# Patient Record
Sex: Female | Born: 1965 | Race: Black or African American | Hispanic: No | Marital: Single | Smoking: Current some day smoker
Health system: Southern US, Community
[De-identification: ages and names within clinical notes are randomized; demographics above are authoritative.]

## PROBLEM LIST (undated history)

## (undated) DIAGNOSIS — G629 Polyneuropathy, unspecified: Secondary | ICD-10-CM

## (undated) HISTORY — PX: ABDOMINAL HYSTERECTOMY: SHX81

---

## 2004-04-02 ENCOUNTER — Emergency Department: Payer: Self-pay | Admitting: Emergency Medicine

## 2005-02-01 ENCOUNTER — Emergency Department: Payer: Self-pay | Admitting: Emergency Medicine

## 2006-04-10 ENCOUNTER — Emergency Department: Payer: Self-pay | Admitting: Emergency Medicine

## 2007-01-29 ENCOUNTER — Emergency Department: Payer: Self-pay | Admitting: Internal Medicine

## 2007-08-30 ENCOUNTER — Emergency Department: Payer: Self-pay | Admitting: Emergency Medicine

## 2007-12-15 ENCOUNTER — Emergency Department: Payer: Self-pay | Admitting: Internal Medicine

## 2008-03-12 ENCOUNTER — Emergency Department: Payer: Self-pay | Admitting: Emergency Medicine

## 2009-03-04 ENCOUNTER — Emergency Department: Payer: Self-pay | Admitting: Emergency Medicine

## 2009-04-24 ENCOUNTER — Emergency Department: Payer: Self-pay | Admitting: Emergency Medicine

## 2010-12-25 ENCOUNTER — Emergency Department: Payer: Self-pay | Admitting: Emergency Medicine

## 2012-10-05 ENCOUNTER — Emergency Department: Payer: Self-pay | Admitting: Internal Medicine

## 2012-10-05 LAB — URINALYSIS, COMPLETE
Blood: NEGATIVE
Nitrite: POSITIVE
Ph: 5 (ref 4.5–8.0)
Protein: 30
RBC,UR: 2 /HPF (ref 0–5)
Specific Gravity: 1.032 (ref 1.003–1.030)
Squamous Epithelial: 22
WBC UR: 10 /HPF (ref 0–5)

## 2012-10-05 LAB — COMPREHENSIVE METABOLIC PANEL
BUN: 10 mg/dL (ref 7–18)
Chloride: 109 mmol/L — ABNORMAL HIGH (ref 98–107)
Co2: 25 mmol/L (ref 21–32)
Creatinine: 0.75 mg/dL (ref 0.60–1.30)
Glucose: 110 mg/dL — ABNORMAL HIGH (ref 65–99)
Osmolality: 275 (ref 275–301)
SGOT(AST): 23 U/L (ref 15–37)
SGPT (ALT): 22 U/L (ref 12–78)
Total Protein: 7.9 g/dL (ref 6.4–8.2)

## 2012-10-05 LAB — CBC
HCT: 38.3 % (ref 35.0–47.0)
HGB: 13.3 g/dL (ref 12.0–16.0)
MCHC: 34.7 g/dL (ref 32.0–36.0)
MCV: 90 fL (ref 80–100)
RDW: 14.9 % — ABNORMAL HIGH (ref 11.5–14.5)
WBC: 10.2 10*3/uL (ref 3.6–11.0)

## 2013-03-06 ENCOUNTER — Emergency Department: Payer: Self-pay | Admitting: Emergency Medicine

## 2013-03-06 LAB — BASIC METABOLIC PANEL
ANION GAP: 4 — AB (ref 7–16)
BUN: 13 mg/dL (ref 7–18)
Calcium, Total: 8.7 mg/dL (ref 8.5–10.1)
Chloride: 105 mmol/L (ref 98–107)
Co2: 27 mmol/L (ref 21–32)
Creatinine: 0.68 mg/dL (ref 0.60–1.30)
EGFR (African American): >60
EGFR (Non-African Amer.): >60
Glucose: 123 mg/dL — ABNORMAL HIGH (ref 65–99)
Osmolality: 273 (ref 275–301)
Potassium: 3.7 mmol/L (ref 3.5–5.1)
Sodium: 136 mmol/L (ref 136–145)

## 2013-03-06 LAB — CBC
HCT: 41.1 % (ref 35.0–47.0)
HGB: 13.7 g/dL (ref 12.0–16.0)
MCH: 30 pg (ref 26.0–34.0)
MCHC: 33.3 g/dL (ref 32.0–36.0)
MCV: 90 fL (ref 80–100)
Platelet: 170 10*3/uL (ref 150–440)
RBC: 4.56 10*6/uL (ref 3.80–5.20)
RDW: 14.5 % (ref 11.5–14.5)
WBC: 8.8 10*3/uL (ref 3.6–11.0)

## 2013-03-06 LAB — TROPONIN I

## 2013-04-19 ENCOUNTER — Emergency Department: Payer: Self-pay | Admitting: Emergency Medicine

## 2013-05-01 ENCOUNTER — Emergency Department: Payer: Self-pay | Admitting: Emergency Medicine

## 2013-07-31 ENCOUNTER — Emergency Department: Payer: Self-pay | Admitting: Emergency Medicine

## 2013-09-06 ENCOUNTER — Emergency Department: Payer: Self-pay | Admitting: Emergency Medicine

## 2013-09-18 ENCOUNTER — Emergency Department (HOSPITAL_COMMUNITY): Payer: Medicaid Other

## 2013-09-18 ENCOUNTER — Encounter (HOSPITAL_COMMUNITY): Payer: Self-pay | Admitting: Emergency Medicine

## 2013-09-18 ENCOUNTER — Emergency Department (HOSPITAL_COMMUNITY)
Admission: EM | Admit: 2013-09-18 | Discharge: 2013-09-19 | Disposition: A | Discharge status: Home / Self Care | Payer: Medicaid Other | Attending: Emergency Medicine | Admitting: Emergency Medicine

## 2013-09-18 DIAGNOSIS — M25579 Pain in unspecified ankle and joints of unspecified foot: Secondary | ICD-10-CM | POA: Insufficient documentation

## 2013-09-18 DIAGNOSIS — E119 Type 2 diabetes mellitus without complications: Secondary | ICD-10-CM | POA: Insufficient documentation

## 2013-09-18 DIAGNOSIS — Z8669 Personal history of other diseases of the nervous system and sense organs: Secondary | ICD-10-CM | POA: Insufficient documentation

## 2013-09-18 DIAGNOSIS — Z79899 Other long term (current) drug therapy: Secondary | ICD-10-CM | POA: Diagnosis not present

## 2013-09-18 DIAGNOSIS — L02619 Cutaneous abscess of unspecified foot: Secondary | ICD-10-CM | POA: Diagnosis not present

## 2013-09-18 DIAGNOSIS — L97509 Non-pressure chronic ulcer of other part of unspecified foot with unspecified severity: Secondary | ICD-10-CM | POA: Insufficient documentation

## 2013-09-18 DIAGNOSIS — L03119 Cellulitis of unspecified part of limb: Principal | ICD-10-CM

## 2013-09-18 DIAGNOSIS — F17200 Nicotine dependence, unspecified, uncomplicated: Secondary | ICD-10-CM | POA: Diagnosis not present

## 2013-09-18 DIAGNOSIS — IMO0002 Reserved for concepts with insufficient information to code with codable children: Secondary | ICD-10-CM

## 2013-09-18 DIAGNOSIS — R209 Unspecified disturbances of skin sensation: Secondary | ICD-10-CM | POA: Insufficient documentation

## 2013-09-18 DIAGNOSIS — L03116 Cellulitis of left lower limb: Secondary | ICD-10-CM

## 2013-09-18 HISTORY — DX: Polyneuropathy, unspecified: G62.9

## 2013-09-18 LAB — CBC WITH DIFFERENTIAL/PLATELET
Basophils Absolute: 0.1 10*3/uL (ref 0.0–0.1)
Basophils Relative: 1 % (ref 0–1)
Eosinophils Absolute: 0.1 10*3/uL (ref 0.0–0.7)
Eosinophils Relative: 1 % (ref 0–5)
HCT: 41.2 % (ref 36.0–46.0)
HEMOGLOBIN: 13.3 g/dL (ref 12.0–15.0)
LYMPHS ABS: 3.7 10*3/uL (ref 0.7–4.0)
Lymphocytes Relative: 36 % (ref 12–46)
MCH: 29.2 pg (ref 26.0–34.0)
MCHC: 32.3 g/dL (ref 30.0–36.0)
MCV: 90.4 fL (ref 78.0–100.0)
MONOS PCT: 6 % (ref 3–12)
Monocytes Absolute: 0.6 10*3/uL (ref 0.1–1.0)
NEUTROS ABS: 5.9 10*3/uL (ref 1.7–7.7)
Neutrophils Relative %: 56 % (ref 43–77)
Platelets: 218 10*3/uL (ref 150–400)
RBC: 4.56 MIL/uL (ref 3.87–5.11)
RDW: 14.7 % (ref 11.5–15.5)
WBC: 10.4 10*3/uL (ref 4.0–10.5)

## 2013-09-18 LAB — BASIC METABOLIC PANEL
Anion gap: 12 (ref 5–15)
BUN: 12 mg/dL (ref 6–23)
CHLORIDE: 106 meq/L (ref 96–112)
CO2: 25 meq/L (ref 19–32)
Calcium: 8.9 mg/dL (ref 8.4–10.5)
Creatinine, Ser: 0.62 mg/dL (ref 0.50–1.10)
GFR calc Af Amer: >90 mL/min (ref >90)
GFR calc non Af Amer: >90 mL/min (ref >90)
GLUCOSE: 88 mg/dL (ref 70–99)
Potassium: 3.6 mEq/L — ABNORMAL LOW (ref 3.7–5.3)
Sodium: 143 mEq/L (ref 137–147)

## 2013-09-18 LAB — HEPATIC FUNCTION PANEL
ALBUMIN: 3.5 g/dL (ref 3.5–5.2)
ALT: 14 U/L (ref 0–35)
AST: 18 U/L (ref 0–37)
Alkaline Phosphatase: 53 U/L (ref 39–117)
BILIRUBIN TOTAL: 0.4 mg/dL (ref 0.3–1.2)
Bilirubin, Direct: <0.2 mg/dL (ref 0.0–0.3)
TOTAL PROTEIN: 7.6 g/dL (ref 6.0–8.3)

## 2013-09-18 LAB — I-STAT TROPONIN, ED: TROPONIN I, POC: 0 ng/mL (ref 0.00–0.08)

## 2013-09-18 LAB — SEDIMENTATION RATE: Sed Rate: 23 mm/hr — ABNORMAL HIGH (ref 0–22)

## 2013-09-18 MED ORDER — OXYCODONE-ACETAMINOPHEN 5-325 MG PO TABS: 1.0000 | ORAL_TABLET | Freq: Four times a day (QID) | ORAL | Status: DC | PRN

## 2013-09-18 MED ORDER — CLINDAMYCIN PHOSPHATE 300 MG/50ML IV SOLN
300.0000 mg | Freq: Once | INTRAVENOUS | Status: AC
  Administered 2013-09-18: 300 mg via INTRAVENOUS
  Filled 2013-09-18: qty 50

## 2013-09-18 MED ORDER — MORPHINE SULFATE 4 MG/ML IJ SOLN
8.0000 mg | Freq: Once | INTRAMUSCULAR | Status: AC
  Administered 2013-09-18: 8 mg via INTRAVENOUS
  Filled 2013-09-18: qty 2

## 2013-09-18 MED ORDER — CLINDAMYCIN HCL 150 MG PO CAPS: 300.0000 mg | ORAL_CAPSULE | Freq: Four times a day (QID) | ORAL | Status: DC

## 2013-09-18 MED ORDER — OXYCODONE-ACETAMINOPHEN 5-325 MG PO TABS
1.0000 | ORAL_TABLET | Freq: Once | ORAL | Status: AC
  Administered 2013-09-18: 1 via ORAL
  Filled 2013-09-18: qty 1

## 2013-09-18 NOTE — ED Notes (Signed)
Reports wearing steel toed boots at work and they irritate her feet. Now have open sores on bilateral feet, drainage from wounds and swelling to feet. Denies fever.

## 2013-09-18 NOTE — Discharge Instructions (Signed)
°Emergency Department Resource Guide °1) Find a Doctor and Pay Out of Pocket °Although you won't have to find out who is covered by your insurance plan, it is a good idea to ask around and get recommendations. You will then need to call the office and see if the doctor you have chosen will accept you as a new patient and what types of options they offer for patients who are self-pay. Some doctors offer discounts or will set up payment plans for their patients who do not have insurance, but you will need to ask so you aren't surprised when you get to your appointment. ° °2) Contact Your Local Health Department °Not all health departments have doctors that can see patients for sick visits, but many do, so it is worth a call to see if yours does. If you don't know where your local health department is, you can check in your phone book. The CDC also has a tool to help you locate your state's health department, and many state websites also have listings of all of their local health departments. ° °3) Find a Walk-in Clinic °If your illness is not likely to be very severe or complicated, you may want to try a walk in clinic. These are popping up all over the country in pharmacies, drugstores, and shopping centers. They're usually staffed by nurse practitioners or physician assistants that have been trained to treat common illnesses and complaints. They're usually fairly quick and inexpensive. However, if you have serious medical issues or chronic medical problems, these are probably not your best option. ° °No Primary Care Doctor: °- Call [facility] at  832-8000 - they can help you locate a primary care doctor that  accepts your insurance, provides certain services, etc. °- Physician Referral Service- 1-800-533-3463 ° °Chronic Pain Problems: °Organization         Address  Phone   Notes  °[facility]  ([phone] Patients need to be referred by their primary care doctor.  ° °Medication  Assistance: °Organization         Address  Phone   Notes  °[place] [facility] [address] °[place], [place] [address] [phone]) [phone] --Must be a resident of [place] °-- Must have NO insurance coverage whatsoever (no Medicaid/ Medicare, etc.) °-- The pt. MUST have a primary care doctor that directs their care regularly and follows them in the community °  °[facility]  (866) 331-1348   °[employer]  (888) 892-1162   ° °Agencies that provide inexpensive medical care: °Organization         Address  Phone   Notes  °[facility]  ([phone]) [phone]   ° Internal Medicine    (336) 832-7272   °[facility] [address] °[place], [place] [address] ([phone]) 832-4777   °[facility] [address], °[place] (336) [phone]   °[facility]    (336) 373-0678   °[facility]    (336) [phone]   °[facility] ° [address], [place] Phone:  (336) [phone], Fax:  ([phone]) [phone] Hours of Operation:  9 am - 6 pm, M-F.  Also accepts Medicaid/Medicare and self-pay.  °[facility] ° [address], [place] Phone: ([phone]) [phone], Fax: ([phone]) [phone]. Hours of Operation:  8:30 am - 5:30 pm, M-F.  Also accepts Medicaid and self-pay.  °[facility] [place] [address], [place] Phone: ([phone]) [phone]   °[facility] [address], [place], [place] (336)723-1848, Ext. 123 Mondays & Thursdays: 7-9 AM.  First 15 patients are seen on a first come, first serve basis. °  ° °Medicaid-accepting [place] Providers: ° °Organization         Address  Phone   Notes  °[facility] [address], [place] ([phone]) [phone] Also accepts self-pay patients.  °[facility] [address], [place] ° (336) [phone]   °[facility] [address], [place]  ([phone]) [phone]   °[facility] [address] [place] [address], [place] ([phone]) [phone]   °[facility] [address], [place]  ° ([phone]) [phone] Only accepts  Access Medicaid patients after they have their name applied to their card.  ° °Self-Pay (no insurance) in [place]: ° °Organization         Address  Phone   Notes  °Sickle Cell Patients, [facility] [address], [place] ([phone]) [phone]   °[facility] [address], [place] ([phone]) [phone]   °[facility] ° [address], [place] (336) [phone]   °[facility]/Dr. Osei-Bonsu ° [address] [place] [address], [place] or [address], [place] [phone]) [phone] Phone number for both [place] and [place] locations is the same.  °[facility] [address], [place] ([phone]) [phone]   °[facility] [address] [place] [address], [place] or [address] ([phone]) [phone] °(336) [phone]   °[facility] [address], [place] (336) 350-1642, phone; (336) [phone], fax Sees patients 1st and 3rd Saturday of every month.  Must not qualify for public or private insurance (i.e. Medicaid, Medicare, [place] Health Choice, Veterans' Benefits) • Household income should be no more than 200% of the poverty level •The clinic cannot treat you if you are pregnant or think you are pregnant • Sexually transmitted diseases are not treated at the clinic.  ° °

## 2013-09-18 NOTE — ED Notes (Signed)
To x-ray

## 2013-09-18 NOTE — ED Notes (Signed)
Patient transported to X-ray 

## 2013-09-18 NOTE — ED Provider Notes (Signed)
CSN: [id number]     Arrival date & time 09/18/13  1349 History   First MD Initiated Contact with Patient 09/18/13 1751     Chief Complaint  Patient presents with  . Foot Pain    Patient is a 48 y.o. female presenting with lower extremity pain.  Foot Pain This is a new problem. Episode onset: 4-5 months ago. The problem occurs constantly. The problem has been gradually worsening. Associated symptoms include numbness (lateral spect, chronic). Pertinent negatives include no chills, fatigue, fever or nausea. The symptoms are aggravated by walking and standing. Treatments tried: lamisil, silvadene, neurontin.   Bilateral foot pain.  Wears steel toed boots at work and they bother feet.  Has had worsening pain and now drainage from sores.  Sores have increased in number.  Drainage is abnormal smelling per pt.  She has been seen by [facility] and [facility] multiple times and given silvadene cream and antifungals per pt report but this has not helped.  New symptoms since prior ED presentations is the drainage and swelling. States she is pre-diabetic and has been taking gabapentin for diabetic neuropathic pain.  Past Medical History  Diagnosis Date  . Neuropathy   . Diabetes mellitus without complication    History reviewed. No pertinent past surgical history. History reviewed. No pertinent family history. History  Substance Use Topics  . Smoking status: Current Every Day Smoker    Types: Cigarettes  . Smokeless tobacco: Not on file  . Alcohol Use: Yes     Comment: occ   OB History   Grav Para Term Preterm Abortions TAB SAB Ect Mult Living                 Review of Systems  Constitutional: Negative for fever, chills and fatigue.  Gastrointestinal: Negative for nausea.  Neurological: Positive for numbness (lateral spect, chronic).      Allergies  Review of patient's allergies indicates no known allergies.  Home Medications   Prior to Admission medications   Medication Sig Start Date  End Date Taking? Authorizing Provider  gabapentin (NEURONTIN) 300 MG capsule Take 300 mg by mouth 3 (three) times daily.   Yes Historical Provider, MD   BP 133/71  Pulse 84  Temp(Src) 98.7 F (37.1 C) (Oral)  Resp 16  Ht  (1.803 m)  Wt 242 lb (109.77 kg)  BMI 33.77 kg/m2  SpO2 100% Physical Exam  Nursing note and vitals reviewed. Constitutional: She is oriented to person, place, and time. She appears well-developed and well-nourished. No distress.  Well appearing  HENT:  Head: Normocephalic and atraumatic.  Nose: Nose normal.  Eyes: Conjunctivae are normal.  Neck: Normal range of motion. Neck supple. No tracheal deviation present.  Cardiovascular: Normal rate, regular rhythm and normal heart sounds.   No murmur heard. Strong DP pulses, cap refill < 3 secs  Pulmonary/Chest: Effort normal and breath sounds normal. No respiratory distress. She has no rales.  Abdominal: Soft. Bowel sounds are normal. She exhibits no distension and no mass. There is no tenderness.  Musculoskeletal:  Edema to dorsum of bilateral feet and toes  Neurological: She is alert and oriented to person, place, and time.  Skin: Skin is warm and dry. No rash noted.  Multiple ulcerations to superficial tissue, more prominent on left localizes to interweb spaces, anterior toes and lateral foot.  Localized erythema but not spreading.  +warmth bilaterally.  Scant purulence to web spaces, granulation tissue to ulcers on anterior toes  Psychiatric: She  has a normal mood and affect.    ED Course  Procedures (including critical care time) Labs Review Labs Reviewed  BASIC METABOLIC PANEL - Abnormal; Notable for the following:    Potassium 3.6 (*)    All other components within normal limits  SEDIMENTATION RATE - Abnormal; Notable for the following:    Sed Rate 23 (*)    All other components within normal limits  CBC WITH DIFFERENTIAL  HEPATIC FUNCTION PANEL  C-REACTIVE PROTEIN  I-STAT TROPOININ, ED     Imaging Review Dg Chest 2 View  09/19/2013   CLINICAL DATA:  Foot pain  EXAM: CHEST  2 VIEW  COMPARISON:  03/09/2013  FINDINGS: Lungs are clear.  No pleural effusion or pneumothorax.  The heart is normal in size.  Visualized osseous structures are within normal limits.  IMPRESSION: No evidence of acute cardiopulmonary disease.   Electronically Signed   By: Charline Bills M.D.   On: 09/19/2013 00:02   Dg Foot Complete Left  09/18/2013   CLINICAL DATA:  Bilateral foot pain and swelling. Open sores on the dorsal side of the toes for 5 months. No known injury.  EXAM: LEFT FOOT - COMPLETE 3+ VIEW  COMPARISON:  None.  FINDINGS: Mild degenerative changes in the interphalangeal joints. No evidence of acute fracture or subluxation. No focal bone lesion or bone destruction. Bone cortex and trabecular architecture appear intact. No radiopaque soft tissue foreign bodies. Dorsal soft tissue swelling.  IMPRESSION: No acute bony abnormalities.   Electronically Signed   By: Burman Nieves M.D.   On: 09/18/2013 21:21   Dg Foot Complete Right  09/18/2013   CLINICAL DATA:  Bilateral foot pain. Dorsal swelling. Open sores on the toes. No injury.  EXAM: RIGHT FOOT COMPLETE - 3+ VIEW  COMPARISON:  None.  FINDINGS: There is no evidence of fracture or dislocation. There is no evidence of arthropathy or other focal bone abnormality. Soft tissues are unremarkable.  IMPRESSION: Negative.   Electronically Signed   By: Burman Nieves M.D.   On: 09/18/2013 21:25     EKG Interpretation None      MDM   Final diagnoses:  Cellulitis of foot, left  Ulcer   Medications  oxyCODONE-acetaminophen (PERCOCET/ROXICET) 5-325 MG per tablet 1 tablet (1 tablet Oral Given 09/18/13 1444)  clindamycin (CLEOCIN) IVPB 300 mg (0 mg Intravenous Stopped 09/18/13 2010)  morphine 4 MG/ML injection 8 mg (8 mg Intravenous Given 09/18/13 1942)  morphine 4 MG/ML injection 8 mg (8 mg Intravenous Given 09/18/13 2148)    Pt presents for management  of chronic wounds to bilateral feet.  Now with drainage and worsening pain.  Pre-diabetic, pt at risk for spreading infection/ osteomyelitis.  N/v intact. Glucose wnl.  Sx c.w. Cellulitis. XRs neg for osteomyelitis.  No evidence of abscess such as induration or fluctuance  9:41 PM complaining of sharp right sided chest pain/ epigastric pain. +diaphoretic. No n/v/dyspnea/wheezes. No sign of anaphylaxis.  EKG ordered, no ischemic changes. Troponin negative. CXR negative.  Pain very atypical, have very veyr low suspicion for ACS.   No signs of DVT, no risk factors for PE.   Pain resolved at discharge. D/c home with analgesia and clindamycin.  PCP f/u within week.  Discussed wound care instructions. Work note provided.    Sofie Rower, MD 09/19/13 [phone]

## 2013-09-18 NOTE — ED Provider Notes (Signed)
I saw and evaluated the patient, reviewed the resident's note and I agree with the findings and plan.   EKG Interpretation   Date/Time:  Sunday September 18 2013 21:40:15 EDT Ventricular Rate:  82 PR Interval:  167 QRS Duration: 101 QT Interval:  411 QTC Calculation: 480 R Axis:   79 Text Interpretation:  Sinus rhythm Borderline T wave abnormalities  Baseline wander in lead(s) III V1 Confirmed by Aroldo Galli,  DO, Teshawn Moan ([id number])  on 09/18/2013 9:49:59 PM      Pt is a 48 y.o. her with history of prediabetes not on medication who presents emergency department with ulcers to her bilateral toes between her digits. This is been present for several months. She states it is do to wearing steel toed boots at her work. She feels that the pain has gotten worse. She states that she does not feel the sores are the worse. She has had some drainage which has been chronic. No fevers or chills. No vomiting or diarrhea. On exam, patient is nontoxic, well-appearing, hemodynamically stable. She has multiple ulcerative lesions in between her digits with some foul-smelling slightly purulent drainage. There is no surrounding erythema or warmth or induration. 2+ DP pulses bilaterally. She does have some mild edema in bilateral feet. No calf tenderness or swelling. Normal sensation diffusely. Labs are unremarkable, x-rays negative. Patient did begin having sharp right-sided chest pain after receiving morphine. Suspect this may be an adverse reaction to this drug. Troponin negative. EKG shows no signs of STEMI.  Symptoms are very atypical and she has no risk factors for ACS other than age and no associated shortness of breath, nausea, vomiting. I feel she is safe to be discharged home.  Gina Maw Hortense Cantrall, DO 09/18/13 2321

## 2013-09-19 LAB — C-REACTIVE PROTEIN: CRP: 0.6 mg/dL — AB (ref <0.60)

## 2013-09-27 ENCOUNTER — Emergency Department: Payer: Self-pay | Admitting: Emergency Medicine

## 2013-09-27 LAB — CBC WITH DIFFERENTIAL/PLATELET
BASOS ABS: 0 10*3/uL (ref 0.0–0.1)
BASOS PCT: 0.6 %
Eosinophil #: 0.1 10*3/uL (ref 0.0–0.7)
Eosinophil %: 0.9 %
HCT: 42 % (ref 35.0–47.0)
HGB: 13.7 g/dL (ref 12.0–16.0)
Lymphocyte #: 2 10*3/uL (ref 1.0–3.6)
Lymphocyte %: 25.2 %
MCH: 29.3 pg (ref 26.0–34.0)
MCHC: 32.6 g/dL (ref 32.0–36.0)
MCV: 90 fL (ref 80–100)
Monocyte #: 0.5 x10 3/mm (ref 0.2–0.9)
Monocyte %: 5.9 %
NEUTROS PCT: 67.4 %
Neutrophil #: 5.5 10*3/uL (ref 1.4–6.5)
PLATELETS: 205 10*3/uL (ref 150–440)
RBC: 4.67 10*6/uL (ref 3.80–5.20)
RDW: 15 % — ABNORMAL HIGH (ref 11.5–14.5)
WBC: 8.1 10*3/uL (ref 3.6–11.0)

## 2013-09-27 LAB — HEMOGLOBIN A1C: HEMOGLOBIN A1C: 5.7 % (ref 4.2–6.3)

## 2013-10-03 ENCOUNTER — Encounter: Payer: Self-pay | Admitting: Surgery

## 2013-10-04 ENCOUNTER — Encounter (HOSPITAL_BASED_OUTPATIENT_CLINIC_OR_DEPARTMENT_OTHER): Payer: Self-pay | Admitting: Emergency Medicine

## 2013-10-13 ENCOUNTER — Encounter: Payer: Self-pay | Admitting: Surgery

## 2013-10-17 ENCOUNTER — Encounter: Payer: Self-pay | Admitting: Surgery

## 2013-11-01 ENCOUNTER — Ambulatory Visit: Payer: Self-pay | Admitting: Vascular Surgery

## 2013-11-13 ENCOUNTER — Encounter: Payer: Self-pay | Admitting: Surgery

## 2013-12-13 ENCOUNTER — Encounter: Payer: Self-pay | Admitting: Surgery

## 2013-12-28 LAB — WOUND AEROBIC CULTURE

## 2014-01-13 ENCOUNTER — Encounter: Payer: Self-pay | Admitting: Surgery

## 2014-01-26 ENCOUNTER — Encounter: Payer: Self-pay | Admitting: General Surgery

## 2014-02-13 ENCOUNTER — Encounter: Payer: Self-pay | Admitting: Surgery

## 2014-02-13 ENCOUNTER — Encounter: Payer: Self-pay | Admitting: General Surgery

## 2014-02-16 ENCOUNTER — Encounter: Payer: Self-pay | Admitting: Surgery

## 2014-03-14 ENCOUNTER — Encounter
Admit: 2014-03-14 | Disposition: A | Discharge status: Home / Self Care | Payer: Self-pay | Attending: Surgery | Admitting: Surgery

## 2014-04-14 ENCOUNTER — Encounter
Admit: 2014-04-14 | Disposition: A | Discharge status: Home / Self Care | Payer: Self-pay | Attending: Surgery | Admitting: Surgery

## 2014-06-18 ENCOUNTER — Emergency Department
Admission: EM | Admit: 2014-06-18 | Discharge: 2014-06-18 | Disposition: A | Discharge status: Home / Self Care | Payer: Medicaid Other | Attending: Emergency Medicine | Admitting: Emergency Medicine

## 2014-06-18 ENCOUNTER — Encounter: Payer: Self-pay | Admitting: Emergency Medicine

## 2014-06-18 DIAGNOSIS — M545 Low back pain, unspecified: Secondary | ICD-10-CM

## 2014-06-18 DIAGNOSIS — Z720 Tobacco use: Secondary | ICD-10-CM | POA: Diagnosis not present

## 2014-06-18 DIAGNOSIS — J069 Acute upper respiratory infection, unspecified: Secondary | ICD-10-CM | POA: Diagnosis not present

## 2014-06-18 DIAGNOSIS — Z79899 Other long term (current) drug therapy: Secondary | ICD-10-CM | POA: Insufficient documentation

## 2014-06-18 DIAGNOSIS — R05 Cough: Secondary | ICD-10-CM | POA: Diagnosis present

## 2014-06-18 MED ORDER — IBUPROFEN 800 MG PO TABS: 800.0000 mg | ORAL_TABLET | Freq: Three times a day (TID) | ORAL | Status: DC | PRN

## 2014-06-18 MED ORDER — OXYMETAZOLINE HCL 0.05 % NA SOLN: 2.0000 | Freq: Two times a day (BID) | NASAL | Status: AC

## 2014-06-18 MED ORDER — CYCLOBENZAPRINE HCL 5 MG PO TABS: 5.0000 mg | ORAL_TABLET | Freq: Three times a day (TID) | ORAL | Status: DC | PRN

## 2014-06-18 MED ORDER — BENZONATATE 100 MG PO CAPS: 100.0000 mg | ORAL_CAPSULE | Freq: Three times a day (TID) | ORAL | Status: DC | PRN

## 2014-06-18 MED ORDER — LORATADINE-PSEUDOEPHEDRINE ER 5-120 MG PO TB12: 1.0000 | ORAL_TABLET | Freq: Two times a day (BID) | ORAL | Status: DC

## 2014-06-18 MED ORDER — HYDROCODONE-ACETAMINOPHEN 5-325 MG PO TABS: 1.0000 | ORAL_TABLET | ORAL | Status: DC | PRN

## 2014-06-18 NOTE — ED Notes (Signed)
Pt with low back pain and cold sx started a couple days ago.

## 2014-06-18 NOTE — ED Provider Notes (Signed)
CSN: [id number]     Arrival date & time 06/18/14  [id number] History   First MD Initiated Contact with Patient 06/18/14 [phone]     Chief Complaint  Patient presents with  . URI     (Consider location/radiation/quality/duration/timing/severity/associated sxs/prior Treatment) HPI Patient presenting today with 3 days of cold symptoms nasal drainage and cough denies fevers chills any other symptoms of note states that with coughing her low back has started to hurt she's had back pain on and off for approximately one month denies any injury denies any chronic issues with back pain states that she is having no pain burning or change in urinary frequency no vaginal bleeding vaginal discharge denies constipation diarrhea denies numbness tingling weakness in any of her extremities and is here today for management of back pain and control of upper respiratory infection symptoms Past Medical History  Diagnosis Date  . Neuropathy    History reviewed. No pertinent past surgical history. No family history on file. History  Substance Use Topics  . Smoking status: Current Every Day Smoker    Types: Cigarettes  . Smokeless tobacco: Not on file  . Alcohol Use: Yes     Comment: occ   OB History    No data available     Review of Systems  Constitutional: Negative.   Eyes: Negative.   Respiratory: Positive for cough.   Gastrointestinal: Negative.   Genitourinary: Negative.   Musculoskeletal: Positive for back pain.  Skin: Negative.   Neurological: Negative.   Hematological: Negative.   All other systems reviewed and are negative.     Allergies  Review of patient's allergies indicates no known allergies.  Home Medications   Prior to Admission medications   Medication Sig Start Date End Date Taking? Authorizing Provider  benzonatate (TESSALON PERLES) 100 MG capsule Take 1 capsule (100 mg total) by mouth 3 (three) times daily as needed for cough. 06/18/14 06/18/15  Javana Schey William C Talin Feister, PA-C   clindamycin (CLEOCIN) 150 MG capsule Take 2 capsules (300 mg total) by mouth every 6 (six) hours. 09/18/13   Sofie RowerMike Stengel, MD  cyclobenzaprine (FLEXERIL) 5 MG tablet Take 1 tablet (5 mg total) by mouth 3 (three) times daily as needed for muscle spasms. 06/18/14 06/18/15  Lynden Carrithers William C Elliotte Marsalis, PA-C  gabapentin (NEURONTIN) 300 MG capsule Take 300 mg by mouth 3 (three) times daily.    Historical Provider, MD  HYDROcodone-acetaminophen (NORCO/VICODIN) 5-325 MG per tablet Take 1 tablet by mouth every 4 (four) hours as needed for moderate pain. 06/18/14   Aura Bibby William C Marston Mccadden, PA-C  ibuprofen (ADVIL,MOTRIN) 800 MG tablet Take 1 tablet (800 mg total) by mouth every 8 (eight) hours as needed. 06/18/14   Bentzion Dauria William C Ailsa Mireles, PA-C  loratadine-pseudoephedrine (CLARITIN-D 12 HOUR) 5-120 MG per tablet Take 1 tablet by mouth 2 (two) times daily. 06/18/14 06/18/15  Shabria Egley William C Analuisa Tudor, PA-C  oxyCODONE-acetaminophen (PERCOCET/ROXICET) 5-325 MG per tablet Take 1 tablet by mouth every 6 (six) hours as needed for severe pain. 09/18/13   Sofie RowerMike Stengel, MD  oxymetazoline (AFRIN) 0.05 % nasal spray Place 2 sprays into both nostrils 2 (two) times daily. For three days 06/18/14 06/20/14  Miosotis Wetsel William C Audi Wettstein, PA-C   BP 131/74 mmHg  Pulse 102  Temp(Src) 98.7 F (37.1 C) (Oral)  Ht 5\' 11"  (1.803 m)  Wt 254 lb (115.214 kg)  BMI 35.44 kg/m2  SpO2 96% Physical Exam Female appearing stated age well-developed well-nourished no acute distress Vitals were reviewed Head ears  eyes nose neck and throat exam reveals a clear rhinorrhea postnasal drainage pharyngeal erythema pupils equal round reactive to light and accommodation extraocular motions are intact Lymphatic patient has no adenopathy Cardiovascular regular rate and rhythm no murmurs rubs gallops Pulmonary lungs clear to auscultation bilaterally Musculoskeletal she is moving all extremities without difficulty she does have tight spasm muscles particularly more so on the left side of  her back there is no palpable deformity step-offs no CVA tenderness Neuro exams nonfocal cranial nerves II through XII grossly intact ED Course  Procedures  MDM  Decision making on this patient she has had cold symptoms for approximately 3 days denies fevers chills states that she's coughed and now is having spasm and pain in her back that has flared up from pain that has been going on for approximately 1 month she is adamant that she is not having any urinary symptoms and abdominal pain and vaginal bleeding vaginal discharge or issues with her bowels will go and treat her for musculoskeletal back pain and upper respiratory infection of a suspected viral etiology Final diagnoses:  URI, acute  Left-sided low back pain without sciatica       Berniece Abid Rosalyn Gess, PA-C 06/18/14 1610  Jene Every, MD 06/18/14 [phone]

## 2015-03-23 ENCOUNTER — Encounter: Payer: Self-pay | Admitting: Emergency Medicine

## 2015-03-23 ENCOUNTER — Emergency Department
Admission: EM | Admit: 2015-03-23 | Discharge: 2015-03-23 | Disposition: A | Discharge status: Home / Self Care | Payer: Medicaid Other | Attending: Emergency Medicine | Admitting: Emergency Medicine

## 2015-03-23 DIAGNOSIS — J069 Acute upper respiratory infection, unspecified: Secondary | ICD-10-CM | POA: Insufficient documentation

## 2015-03-23 DIAGNOSIS — F17200 Nicotine dependence, unspecified, uncomplicated: Secondary | ICD-10-CM | POA: Insufficient documentation

## 2015-03-23 DIAGNOSIS — Z79899 Other long term (current) drug therapy: Secondary | ICD-10-CM | POA: Insufficient documentation

## 2015-03-23 MED ORDER — FLUTICASONE PROPIONATE 50 MCG/ACT NA SUSP: 2.0000 | Freq: Every day | NASAL | Status: DC

## 2015-03-23 NOTE — Discharge Instructions (Signed)
Upper Respiratory Infection, Adult Most upper respiratory infections (URIs) are caused by a virus. A URI affects the nose, throat, and upper air passages. The most common type of URI is often called "the common cold." HOME CARE   Take medicines only as told by your doctor.  Gargle warm saltwater or take cough drops to comfort your throat as told by your doctor.  Use a warm mist humidifier or inhale steam from a shower to increase air moisture. This may make it easier to breathe.  Drink enough fluid to keep your pee (urine) clear or pale yellow.  Eat soups and other clear broths.  Have a healthy diet.  Rest as needed.  Go back to work when your fever is gone or your doctor says it is okay.  You may need to stay home longer to avoid giving your URI to others.  You can also wear a face mask and wash your hands often to prevent spread of the virus.  Use your inhaler more if you have asthma.  Do not use any tobacco products, including cigarettes, chewing tobacco, or electronic cigarettes. If you need help quitting, ask your doctor. GET HELP IF:  You are getting worse, not better.  Your symptoms are not helped by medicine.  You have chills.  You are getting more short of breath.  You have brown or red mucus.  You have yellow or brown discharge from your nose.  You have pain in your face, especially when you bend forward.  You have a fever.  You have puffy (swollen) neck glands.  You have pain while swallowing.  You have white areas in the back of your throat. GET HELP RIGHT AWAY IF:   You have very bad or constant:  Headache.  Ear pain.  Pain in your forehead, behind your eyes, and over your cheekbones (sinus pain).  Chest pain.  You have long-lasting (chronic) lung disease and any of the following:  Wheezing.  Long-lasting cough.  Coughing up blood.  A change in your usual mucus.  You have a stiff neck.  You have changes in  your:  Vision.  Hearing.  Thinking.  Mood. MAKE SURE YOU:   Understand these instructions.  Will watch your condition.  Will get help right away if you are not doing well or get worse.   This information is not intended to replace advice given to you by your health care provider. Make sure you discuss any questions you have with your health care provider.   Document Released: 06/18/2007 Document Revised: 05/16/2014 Document Reviewed: 04/06/2013 Elsevier Interactive Patient Education 2016 [employer]   Obtain Sudafed at the pharmacy counter and take as directed. Begin using Flonase nasal spray 2 sprays each nostril daily. Increase fluids, Tylenol or ibuprofen as needed for pain. Call and make an appointment with your doctor if any continued problems.

## 2015-03-23 NOTE — ED Notes (Signed)
Patient c/o of congestion with "pressure" in bilateral ears. Nasal drainage and sore throat noted. +N/V x 1

## 2015-03-23 NOTE — ED Notes (Addendum)
Pt presents to ED with vomiting X1 this morning and dizziness since "yesterday" with nausea. Pt states her "ears are starting to hurt a little bit" as well. Pt currently has no increased work of breathing or acute distress noted at this time. Skin warm and dry.

## 2015-03-23 NOTE — ED Provider Notes (Signed)
[facility] Emergency Department Provider Note  ____________________________________________  Time seen: Approximately 7:35 AM  I have reviewed the triage vital signs and the nursing notes.   HISTORY  Chief Complaint Emesis   HPI Gina Cannon is a 50 y.o. female is here with complaint of congestion and "pressure" bilateral ears. Patient states she's had some nasal drainage and sore throat. She's had some nausea and vomiting for one day. She denies any diarrhea and is unaware of any fever. Currently she is taking TheraFlu for her upper respiratory symptoms. At this time she rates her pain as a 0/10.   Past Medical History  Diagnosis Date  . Neuropathy (HCC)     There are no active problems to display for this patient.   History reviewed. No pertinent past surgical history.  Current Outpatient Rx  Name  Route  Sig  Dispense  Refill  . fluticasone (FLONASE) 50 MCG/ACT nasal spray   Each Nare   Place 2 sprays into both nostrils daily.   16 g   0   . gabapentin (NEURONTIN) 300 MG capsule   Oral   Take 300 mg by mouth 3 (three) times daily.           Allergies Review of patient's allergies indicates no known allergies.  No family history on file.  Social History Social History  Substance Use Topics  . Smoking status: Current Some Day Smoker -- 0.50 packs/day  . Smokeless tobacco: Never Used  . Alcohol Use: Yes     Comment: occ    Review of Systems Constitutional: No fever/chills Eyes: No visual changes. ENT: Positive sore throat. Positive ear pain. Positive nasal drainage. Cardiovascular: Denies chest pain. Respiratory: Denies shortness of breath. Gastrointestinal: No abdominal pain.  No nausea, no vomiting.  No diarrhea.   Skin: Negative for rash. Neurological: Negative for headaches, focal weakness or numbness.  10-point ROS otherwise negative.  ____________________________________________   PHYSICAL EXAM:  VITAL  SIGNS: ED Triage Vitals  Enc Vitals Group     BP 03/23/15 0607 131/77 mmHg     Pulse Rate 03/23/15 0607 72     Resp 03/23/15 0607 18     Temp 03/23/15 0607 98.1 F (36.7 C)     Temp Source 03/23/15 0607 Oral     SpO2 03/23/15 0607 100 %     Weight 03/23/15 0607 250 lb (113.399 kg)     Height 03/23/15 0607 5\' 11"  (1.803 m)     Head Cir --      Peak Flow --      Pain Score 03/23/15 0607 0     Pain Loc --      Pain Edu? --      Excl. in GC? --     Constitutional: Alert and oriented. Well appearing and in no acute distress. Eyes: Conjunctivae are normal. PERRL. EOMI. Head: Atraumatic. Nose: Positive congestion/rhinnorhea. EACs clear bilaterally. TMs are dull without erythema. Light reflex is within normal limits. Mouth/Throat: Mucous membranes are moist.  Oropharynx non-erythematous. Neck: No stridor.   Hematological/Lymphatic/Immunilogical: No cervical lymphadenopathy. Cardiovascular: Normal rate, regular rhythm. Grossly normal heart sounds.  Good peripheral circulation. Respiratory: Normal respiratory effort.  No retractions. Lungs CTAB. Musculoskeletal: No lower extremity tenderness nor edema.  No joint effusions. Neurologic:  Normal speech and language.  No gait instability. Skin:  Skin is warm, dry and intact. No rash noted. Psychiatric: Mood and affect are normal. Speech and behavior are normal.  ____________________________________________   LABS (all  labs ordered are listed, but only abnormal results are displayed)  Labs Reviewed - No data to display  PROCEDURES  Procedure(s) performed: None  Critical Care performed: No  ____________________________________________   INITIAL IMPRESSION / ASSESSMENT AND PLAN / ED COURSE  Pertinent labs & imaging results that were available during my care of the patient were reviewed by me and considered in my medical decision making (see chart for details).  Patient was given a prescription for Flonase nasal spray. She is  also encouraged to pick up Sudafed as needed for congestion and increase fluids. She may also take Tylenol or ibuprofen as needed for ear pain. She is encouraged to follow-up with her primary care doctor if any continued positive. ____________________________________________   FINAL CLINICAL IMPRESSION(S) / ED DIAGNOSES  Final diagnoses:  Acute upper respiratory infection      Tommi Rumps, PA-C 03/23/15 1043  Sharyn Creamer, MD 03/23/15 1517

## 2015-05-02 DIAGNOSIS — N879 Dysplasia of cervix uteri, unspecified: Secondary | ICD-10-CM | POA: Insufficient documentation

## 2015-05-16 ENCOUNTER — Encounter: Payer: Self-pay | Admitting: *Deleted

## 2015-05-16 ENCOUNTER — Emergency Department
Admission: EM | Admit: 2015-05-16 | Discharge: 2015-05-16 | Disposition: A | Discharge status: Home / Self Care | Payer: Medicaid Other | Attending: Emergency Medicine | Admitting: Emergency Medicine

## 2015-05-16 DIAGNOSIS — F17200 Nicotine dependence, unspecified, uncomplicated: Secondary | ICD-10-CM | POA: Insufficient documentation

## 2015-05-16 DIAGNOSIS — H109 Unspecified conjunctivitis: Secondary | ICD-10-CM | POA: Insufficient documentation

## 2015-05-16 DIAGNOSIS — N289 Disorder of kidney and ureter, unspecified: Secondary | ICD-10-CM | POA: Insufficient documentation

## 2015-05-16 MED ORDER — LORATADINE 10 MG PO TABS: 10.0000 mg | ORAL_TABLET | Freq: Every day | ORAL | Status: DC

## 2015-05-16 MED ORDER — POLYMYXIN B-TRIMETHOPRIM 10000-0.1 UNIT/ML-% OP SOLN: 1.0000 [drp] | Freq: Four times a day (QID) | OPHTHALMIC | Status: DC

## 2015-05-16 NOTE — ED Notes (Signed)
States bilateral eye redness and irritation for over 1 week

## 2015-05-16 NOTE — ED Notes (Signed)
Both eyes red and irritated for about 1 week  Worse this am

## 2015-05-16 NOTE — ED Provider Notes (Signed)
[facility] Emergency Department Provider Note  ____________________________________________  Time seen: Approximately 10:05 AM  I have reviewed the triage vital signs and the nursing notes.   HISTORY  Chief Complaint Conjunctivitis    HPI Gina Cannon is a 50 y.o. female , NAD, presents emergency department with 1 week history of bilateral eye redness, itching and irritation. States the symptoms have been worsening over the last 2 days. Right is worse than left. Did wake with right arm at a shunt this morning. States the eyes burn. Has not had any injury or trauma to the eyes. Denies any nasal congestion, runny nose, sneezing, ear pain, sinus pressure. Denies any foreign body sensation about the eyes. No visual changes. Has not taken anything over-the-counter at this time.   Past Medical History  Diagnosis Date  . Neuropathy (HCC)     There are no active problems to display for this patient.   History reviewed. No pertinent past surgical history.  Current Outpatient Rx  Name  Route  Sig  Dispense  Refill  . fluticasone (FLONASE) 50 MCG/ACT nasal spray   Each Nare   Place 2 sprays into both nostrils daily.   16 g   0   . gabapentin (NEURONTIN) 300 MG capsule   Oral   Take 300 mg by mouth 3 (three) times daily.         Marland Kitchen. loratadine (CLARITIN) 10 MG tablet   Oral   Take 1 tablet (10 mg total) by mouth daily.   30 tablet   0   . trimethoprim-polymyxin b (POLYTRIM) ophthalmic solution   Both Eyes   Place 1 drop into both eyes every 6 (six) hours.   10 mL   0     Allergies Review of patient's allergies indicates no known allergies.  History reviewed. No pertinent family history.  Social History Social History  Substance Use Topics  . Smoking status: Current Some Day Smoker -- 0.50 packs/day  . Smokeless tobacco: Never Used  . Alcohol Use: Yes     Comment: occ     Review of Systems  Constitutional: No fever/chills  Eyes:  Positive bilateral eye redness, irritation, discharge. Positive bilateral eyelid swelling and crusting. No visual changes. ENT: No sore throat, ear pain, nasal congestion, runny nose, sneezing, sinus pressure. Cardiovascular: No chest pain. Respiratory: No cough. No shortness of breath. No wheezing.  Musculoskeletal: Negative for general myalgias.  Skin: Positive redness, swelling bilateral upper and lower eyelids. Negative for rash. Neurological: Negative for headaches, focal weakness or numbness. 10-point ROS otherwise negative.  ____________________________________________   PHYSICAL EXAM:  VITAL SIGNS: ED Triage Vitals  Enc Vitals Group     BP 05/16/15 0920 122/68 mmHg     Pulse Rate 05/16/15 0920 83     Resp 05/16/15 0920 20     Temp 05/16/15 0920 98.6 F (37 C)     Temp Source 05/16/15 0920 Oral     SpO2 05/16/15 0920 99 %     Weight 05/16/15 0920 250 lb (113.399 kg)     Height 05/16/15 0920 5\' 11"  (1.803 m)     Head Cir --      Peak Flow --      Pain Score --      Pain Loc --      Pain Edu? --      Excl. in GC? --      Constitutional: Alert and oriented. Well appearing and in no acute distress. Eyes:Bilateral conjunctivae  with moderate erythema and injection. Clear, mucoid drainage noted bilaterally with right greater than left. No tenderness to palpation of bilateral globes. PERRLA. EOMI without pain.  Head: Atraumatic. ENT:      Nose: No congestion/rhinnorhea.      Mouth/Throat: Mucous membranes are moist. Pharynx without erythema, swelling, exudate. He is midline. Neck: Supple with full range of motion. Hematological/Lymphatic/Immunilogical: No cervical lymphadenopathy. Respiratory: Normal respiratory effort without tachypnea or retractions. Neurologic:  Normal speech and language. No gross focal neurologic deficits are appreciated.  Skin:  Skin is warm, dry and intact. No rash noted. Psychiatric: Mood and affect are normal. Speech and behavior are normal.  Patient exhibits appropriate insight and judgement.   ____________________________________________   LABS  None  ____________________________________________  EKG  None ____________________________________________  RADIOLOGY  None ____________________________________________    PROCEDURES  Procedure(s) performed: None      Medications - No data to display   ____________________________________________   INITIAL IMPRESSION / ASSESSMENT AND PLAN / ED COURSE  Patient's diagnosis is consistent with bilateral conjunctivitis. Patient will be discharged home with prescriptions for Polytrim ophthalmic drops and loratadine to take as directed. Patient is to follow up with her primary care provider or [facility] if symptoms persist past this treatment course. Patient is given ED precautions to return to the ED for any worsening or new symptoms.      ____________________________________________  FINAL CLINICAL IMPRESSION(S) / ED DIAGNOSES  Final diagnoses:  Bilateral conjunctivitis      NEW MEDICATIONS STARTED DURING THIS VISIT:  New Prescriptions   LORATADINE (CLARITIN) 10 MG TABLET    Take 1 tablet (10 mg total) by mouth daily.   TRIMETHOPRIM-POLYMYXIN B (POLYTRIM) OPHTHALMIC SOLUTION    Place 1 drop into both eyes every 6 (six) hours.         Hope Pigeon, PA-C 05/16/15 1017  Governor Rooks, MD 05/16/15 1215

## 2015-05-16 NOTE — Discharge Instructions (Signed)
Bacterial Conjunctivitis °Bacterial conjunctivitis, commonly called pink eye, is an inflammation of the clear membrane that covers the white part of the eye (conjunctiva). The inflammation can also happen on the underside of the eyelids. The blood vessels in the conjunctiva become inflamed, causing the eye to become red or pink. Bacterial conjunctivitis may spread easily from one eye to another and from person to person (contagious).  °CAUSES  °Bacterial conjunctivitis is caused by bacteria. The bacteria may come from your own skin, your upper respiratory tract, or from someone else with bacterial conjunctivitis. °SYMPTOMS  °The normally white color of the eye or the underside of the eyelid is usually pink or red. The pink eye is usually associated with irritation, tearing, and some sensitivity to light. Bacterial conjunctivitis is often associated with a thick, yellowish discharge from the eye. The discharge may turn into a crust on the eyelids overnight, which causes your eyelids to stick together. If a discharge is present, there may also be some blurred vision in the affected eye. °DIAGNOSIS  °Bacterial conjunctivitis is diagnosed by your caregiver through an eye exam and the symptoms that you report. Your caregiver looks for changes in the surface tissues of your eyes, which may point to the specific type of conjunctivitis. A sample of any discharge may be collected on a cotton-tip swab if you have a severe case of conjunctivitis, if your cornea is affected, or if you keep getting repeat infections that do not respond to treatment. The sample will be sent to a lab to see if the inflammation is caused by a bacterial infection and to see if the infection will respond to antibiotic medicines. °TREATMENT  °1. Bacterial conjunctivitis is treated with antibiotics. Antibiotic eyedrops are most often used. However, antibiotic ointments are also available. Antibiotics pills are sometimes used. Artificial tears or eye  washes may ease discomfort. °HOME CARE INSTRUCTIONS  °1. To ease discomfort, apply a cool, clean washcloth to your eye for 10-20 minutes, 3-4 times a day. °2. Gently wipe away any drainage from your eye with a warm, wet washcloth or a cotton ball. °3. Wash your hands often with soap and water. Use paper towels to dry your hands. °4. Do not share towels or washcloths. This may spread the infection. °5. Change or wash your pillowcase every day. °6. You should not use eye makeup until the infection is gone. °7. Do not operate machinery or drive if your vision is blurred. °8. Stop using contact lenses. Ask your caregiver how to sterilize or replace your contacts before using them again. This depends on the type of contact lenses that you use. °9. When applying medicine to the infected eye, do not touch the edge of your eyelid with the eyedrop bottle or ointment tube. °SEEK IMMEDIATE MEDICAL CARE IF:  °· Your infection has not improved within 3 days after beginning treatment. °· You had yellow discharge from your eye and it returns. °· You have increased eye pain. °· Your eye redness is spreading. °· Your vision becomes blurred. °· You have a fever or persistent symptoms for more than 2-3 days. °· You have a fever and your symptoms suddenly get worse. °· You have facial pain, redness, or swelling. °MAKE SURE YOU:  °· Understand these instructions. °· Will watch your condition. °· Will get help right away if you are not doing well or get worse. °  °This information is not intended to replace advice given to you by your health care provider. Make sure you   discuss any questions you have with your health care provider.   Document Released: 12/30/2004 Document Revised: 01/20/2014 Document Reviewed: 06/02/2011 Elsevier Interactive Patient Education 2016 [employer].  How to Use Eye Drops and Eye Ointments HOW TO APPLY EYE DROPS Follow these steps when applying eye drops: 2. Wash your hands. 3. Tilt your head  back. 4. Put a finger under your eye and use it to gently pull your lower lid downward. Keep that finger in place. 5. Using your other hand, hold the dropper between your thumb and index finger. 6. Position the dropper just over the edge of the lower lid. Hold it as close to your eye as you can without touching the dropper to your eye. 7. Steady your hand. One way to do this is to lean your index finger against your brow. 8. Look up. 9. Slowly and gently squeeze one drop of medicine into your eye. 10. Close your eye. 11. Place a finger between your lower eyelid and your nose. Press gently for 2 minutes. This increases the amount of time that the medicine is exposed to the eye. It also reduces side effects that can develop if the drop gets into the bloodstream through the nose. HOW TO APPLY EYE OINTMENTS Follow these steps when applying eye ointments: 10. Wash your hands. 11. Put a finger under your eye and use it to gently pull your lower lid downward. Keep that finger in place. 12. Using your other hand, place the tip of the tube between your thumb and index finger with the remaining fingers braced against your cheek or nose. 13. Hold the tube just over the edge of your lower lid without touching the tube to your lid or eyeball. 14. Look up. 15. Line the inner part of your lower lid with ointment. 16. Gently pull up on your upper lid and look down. This will force the ointment to spread over the surface of the eye. 17. Release the upper lid. 18. If you can, close your eyes for 1-2 minutes. Do not rub your eyes. If you applied the ointment correctly, your vision will be blurry for a few minutes. This is normal. ADDITIONAL INFORMATION  Make sure to use the eye drops or ointment as told by your health care provider.  If you have been told to use both eye drops and an eye ointment, apply the eye drops first, then wait 3-4 minutes before you apply the ointment.  Try not to touch the tip of the  dropper or tube to your eye. A dropper or tube that has touched the eye can become contaminated.   This information is not intended to replace advice given to you by your health care provider. Make sure you discuss any questions you have with your health care provider.   Document Released: 04/07/2000 Document Revised: 05/16/2014 Document Reviewed: 12/26/2013 Elsevier Interactive Patient Education 2016 [employer].   Allergic Conjunctivitis    A thin, clear membrane (conjunctiva) covers the white part of your eye and the inner surface of your eyelid. Allergic conjunctivitis happens when this membrane gets irritated. This is caused by allergies. Common things (allergens) that can cause an allergic reaction include:  Dust.  Pollen.  Mold.  Animal:  Hair.  Fur.  Skin.  Saliva or other animal fluids. This condition can make your eye red or pink. It can also make your eye feel itchy. This condition cannot be spread by one person to another person (noncontagious).  HOME CARE  Take or  apply medicines only as told by your doctor.  Avoid touching or rubbing your eyes.  Apply a cool, clean washcloth to your eye for 10-20 minutes. Do this 3-4 times a day.  If you wear contact lenses, do not wear them until the irritation is gone. Wear glasses in the meantime.  Avoid wearing eye makeup until the irritation is gone.  Try to avoid whatever allergen is causing the allergic reaction. GET HELP IF:  Your symptoms get worse.  You have pus draining from your eyes.  You have new symptoms.  You have a fever. This information is not intended to replace advice given to you by your health care provider. Make sure you discuss any questions you have with your health care provider.  Document Released: 06/19/2009 Document Revised: 01/20/2014 Document Reviewed: 10/11/2013  Elsevier Interactive Patient Education [employer]2016 [employer]

## 2016-02-10 ENCOUNTER — Encounter: Payer: Self-pay | Admitting: Emergency Medicine

## 2016-02-10 ENCOUNTER — Emergency Department: Payer: BLUE CROSS/BLUE SHIELD

## 2016-02-10 ENCOUNTER — Emergency Department
Admission: EM | Admit: 2016-02-10 | Discharge: 2016-02-10 | Disposition: A | Discharge status: Home / Self Care | Payer: BLUE CROSS/BLUE SHIELD | Attending: Emergency Medicine | Admitting: Emergency Medicine

## 2016-02-10 DIAGNOSIS — R05 Cough: Secondary | ICD-10-CM | POA: Diagnosis present

## 2016-02-10 DIAGNOSIS — J111 Influenza due to unidentified influenza virus with other respiratory manifestations: Secondary | ICD-10-CM | POA: Diagnosis not present

## 2016-02-10 DIAGNOSIS — F17200 Nicotine dependence, unspecified, uncomplicated: Secondary | ICD-10-CM | POA: Insufficient documentation

## 2016-02-10 DIAGNOSIS — Z79899 Other long term (current) drug therapy: Secondary | ICD-10-CM | POA: Diagnosis not present

## 2016-02-10 LAB — INFLUENZA PANEL BY PCR (TYPE A & B)
Influenza A By PCR: POSITIVE — AB
Influenza B By PCR: NEGATIVE

## 2016-02-10 MED ORDER — IBUPROFEN 800 MG PO TABS
800.0000 mg | ORAL_TABLET | Freq: Once | ORAL | Status: DC
Start: 2016-02-10 — End: 2016-02-10
  Filled 2016-02-10: qty 1

## 2016-02-10 MED ORDER — SODIUM CHLORIDE 0.9 % IV BOLUS (SEPSIS)
1000.0000 mL | Freq: Once | INTRAVENOUS | Status: AC
  Administered 2016-02-10: 1000 mL via INTRAVENOUS

## 2016-02-10 MED ORDER — OSELTAMIVIR PHOSPHATE 75 MG PO CAPS: 75.0000 mg | ORAL_CAPSULE | Freq: Two times a day (BID) | ORAL | 0 refills | Status: AC

## 2016-02-10 MED ORDER — ACETAMINOPHEN 325 MG PO TABS
650.0000 mg | ORAL_TABLET | Freq: Once | ORAL | Status: AC | PRN
  Administered 2016-02-10: 650 mg via ORAL

## 2016-02-10 MED ORDER — ACETAMINOPHEN 325 MG PO TABS
ORAL_TABLET | ORAL | Status: AC
  Filled 2016-02-10: qty 2

## 2016-02-10 NOTE — ED Provider Notes (Signed)
[facility] Emergency Department Provider Note  ____________________________________________  Time seen: Approximately 9:04 PM  I have reviewed the triage vital signs and the nursing notes.   HISTORY  Chief Complaint Generalized Body Aches and Cough    HPI Gina Cannon is a 51 y.o. female presenting to the emergency department with tachycardia, headache, rhinorrhea, congestion, body aches and nonproductive cough. Fever has been as high as 102F assessed orally. Patient has had diminished appetite. She is stolerating fluids by mouth. No recent travel. Patient denies chest pain, chest tightness, shortness of breath, abdominal pain, dysuria and increased urinary frequency. No alleviating measures haven't attempted.   Past Medical History:  Diagnosis Date  . Neuropathy (HCC)     There are no active problems to display for this patient.   History reviewed. No pertinent surgical history.  Prior to Admission medications   Medication Sig Start Date End Date Taking? Authorizing Provider  fluticasone (FLONASE) 50 MCG/ACT nasal spray Place 2 sprays into both nostrils daily. 03/23/15 03/22/16  Tommi Rumps, PA-C  gabapentin (NEURONTIN) 300 MG capsule Take 300 mg by mouth 3 (three) times daily.    Historical Provider, MD  loratadine (CLARITIN) 10 MG tablet Take 1 tablet (10 mg total) by mouth daily. 05/16/15   Jami L Hagler, PA-C  oseltamivir (TAMIFLU) 75 MG capsule Take 1 capsule (75 mg total) by mouth 2 (two) times daily. 02/10/16 02/15/16  Orvil Feil, PA-C  trimethoprim-polymyxin b (POLYTRIM) ophthalmic solution Place 1 drop into both eyes every 6 (six) hours. 05/16/15   Jami L Hagler, PA-C    Allergies Patient has no known allergies.  No family history on file.  Social History Social History  Substance Use Topics  . Smoking status: Current Some Day Smoker    Packs/day: 0.50  . Smokeless tobacco: Never Used  . Alcohol use Yes     Comment: occ      Review of Systems  Constitutional: Patient has had fever.  Eyes: No visual changes. No discharge ENT: Patient has had congestion.  Cardiovascular: no chest pain. Respiratory: Patient has had non-productive cough.  No SOB. Gastrointestinal: Patient has had nausea.  Genitourinary: Negative for dysuria. No hematuria Musculoskeletal: Patient has had myalgias. Skin: Negative for rash, abrasions, lacerations, ecchymosis. Neurological: Negative for headaches, focal weakness or numbness.  ____________________________________________   PHYSICAL EXAM:  VITAL SIGNS: ED Triage Vitals [02/10/16 1143]  Enc Vitals Group     BP (!) 143/74     Pulse Rate (!) 128     Resp 20     Temp (!) 102.2 F (39 C)     Temp Source Oral     SpO2 100 %     Weight 240 lb (108.9 kg)     Height 5\' 11"  (1.803 m)     Head Circumference      Peak Flow      Pain Score 8     Pain Loc      Pain Edu?      Excl. in GC?      Constitutional: Alert and oriented. Patient is lying supine in bed.  Eyes: Conjunctivae are normal. PERRL. EOMI. Head: Atraumatic. ENT:      Ears: Tympanic membranes are injected bilaterally without evidence of effusion or purulent exudate. Bony landmarks are visualized bilaterally. No pain with palpation at the tragus.      Nose: Nasal turbinates are edematous and erythematous. Copious rhinorrhea visualized.      Mouth/Throat: Mucous membranes are  moist. Posterior pharynx is mildly erythematous. No tonsillar hypertrophy or purulent exudate. Uvula is midline. Neck: Full range of motion. No pain is elicited with flexion at the neck. Hematological/Lymphatic/Immunilogical: No cervical lymphadenopathy. Cardiovascular: Tachycardic, regular rhythm. Normal S1 and S2.  Good peripheral circulation. Respiratory: Normal respiratory effort without tachypnea or retractions. Lungs CTAB. Good air entry to the bases with no decreased or absent breath sounds. Gastrointestinal: Bowel sounds 4  quadrants. Soft and nontender to palpation. No guarding or rigidity. No palpable masses. No distention. No CVA tenderness.  Skin:  Skin is warm, dry and intact. No rash noted. Psychiatric: Mood and affect are normal. Speech and behavior are normal. Patient exhibits appropriate insight and judgement..  ____________________________________________   LABS (all labs ordered are listed, but only abnormal results are displayed)  Labs Reviewed  INFLUENZA PANEL BY PCR (TYPE A & B) - Abnormal; Notable for the following:       Result Value   Influenza A By PCR POSITIVE (*)    All other components within normal limits   ____________________________________________  EKG   ____________________________________________  RADIOLOGY Geraldo PitterI, Quanta Roher M Cottrell Gentles, personally viewed and evaluated these images (plain radiographs) as part of my medical decision making, as well as reviewing the written report by the radiologist.  Dg Chest 2 View  Result Date: 02/10/2016 CLINICAL DATA:  Cough, fever, body aches, nausea and vomiting, and congestion starting on Friday. EXAM: CHEST  2 VIEW COMPARISON:  11/01/2013 FINDINGS: Airway thickening is present, suggesting bronchitis or reactive airways disease. No discrete airspace opacity. No pleural effusion. Cardiac and mediastinal margins appear normal. IMPRESSION: 1. Airway thickening is present, suggesting bronchitis or reactive airways disease. Electronically Signed   By: Gaylyn RongWalter  Liebkemann M.D.   On: 02/10/2016 12:14    ____________________________________________    PROCEDURES  Procedure(s) performed:    Procedures    Medications  acetaminophen (TYLENOL) tablet 650 mg (650 mg Oral Given 02/10/16 1147)  sodium chloride 0.9 % bolus 1,000 mL (0 mLs Intravenous Stopped 02/10/16 1613)  sodium chloride 0.9 % bolus 1,000 mL (0 mLs Intravenous Stopped 02/10/16 1614)     ____________________________________________   INITIAL IMPRESSION / ASSESSMENT AND PLAN / ED  COURSE  Pertinent labs & imaging results that were available during my care of the patient were reviewed by me and considered in my medical decision making (see chart for details).  Review of the [place] CSRS was performed in accordance of the [facility] prior to dispensing any controlled drugs.     Assessment and Plan:  Influenza:  Patient presents to the emergency department with headache, rhinorrhea, congestion, body aches and nonproductive cough. DG Chest revealed no consolidations or findings consistent with pneumonia. Patient tested positive for influenza A in the emergency department. Patient was given IV fluids. Tachycardia largely resolved with supplemental fluids. Patient was discharged with Tamiflu. She was advised to follow-up with her primary care provider in one week. Strict return precautions were given. All patient questions were answered. __________________________________________  FINAL CLINICAL IMPRESSION(S) / ED DIAGNOSES  Final diagnoses:  Influenza      NEW MEDICATIONS STARTED DURING THIS VISIT:  Discharge Medication List as of 02/10/2016  2:05 PM          This chart was dictated using voice recognition software/Dragon. Despite best efforts to proofread, errors can occur which can change the meaning. Any change was purely unintentional.    Orvil FeilJaclyn M Judie Hollick, PA-C 02/10/16 2111    Jene Everyobert Kinner, MD 02/11/16 [phone]

## 2016-02-10 NOTE — ED Notes (Signed)
IVF infusing

## 2016-02-10 NOTE — ED Provider Notes (Signed)
[facility] Emergency Department Provider Note   First MD Initiated Contact with Patient 02/10/16 1245     (approximate)  I have reviewed the triage vital signs and the nursing notes.   HISTORY  Chief Complaint Generalized Body Aches and Cough    HPI Mallory ShirkShelia K Trombetta is a 51 y.o. female presents with one-day history of cough fever body aches nausea vomiting and congestion. Patient unsure if she had any sick contacts. Patient afebrile on presentation temperature 102.2   Past Medical History:  Diagnosis Date  . Neuropathy (HCC)     There are no active problems to display for this patient.   History reviewed. No pertinent surgical history.  Prior to Admission medications   Medication Sig Start Date End Date Taking? Authorizing Provider  fluticasone (FLONASE) 50 MCG/ACT nasal spray Place 2 sprays into both nostrils daily. 03/23/15 03/22/16  Tommi Rumpshonda L Summers, PA-C  gabapentin (NEURONTIN) 300 MG capsule Take 300 mg by mouth 3 (three) times daily.    Historical Provider, MD  loratadine (CLARITIN) 10 MG tablet Take 1 tablet (10 mg total) by mouth daily. 05/16/15   Jami L Hagler, PA-C  trimethoprim-polymyxin b (POLYTRIM) ophthalmic solution Place 1 drop into both eyes every 6 (six) hours. 05/16/15   Jami L Hagler, PA-C    Allergies No known drug allergies No family history on file.  Social History Social History  Substance Use Topics  . Smoking status: Current Some Day Smoker    Packs/day: 0.50  . Smokeless tobacco: Never Used  . Alcohol use Yes     Comment: occ    Review of Systems Constitutional: Positive for fever/chills Eyes: No visual changes. ENT: No sore throat. Cardiovascular: Denies chest pain. Respiratory: Denies shortness of breath. Positive for cough Gastrointestinal: No abdominal pain.  No nausea, no vomiting.  No diarrhea.  No constipation. Genitourinary: Negative for dysuria. Musculoskeletal: Negative for back pain. Positive for  generalized body aches Skin: Negative for rash. Neurological: Negative for headaches, focal weakness or numbness.  10-point ROS otherwise negative.  ____________________________________________   PHYSICAL EXAM:  VITAL SIGNS: ED Triage Vitals [02/10/16 1143]  Enc Vitals Group     BP (!) 143/74     Pulse Rate (!) 128     Resp 20     Temp (!) 102.2 F (39 C)     Temp Source Oral     SpO2 100 %     Weight 240 lb (108.9 kg)     Height 5\' 11"  (1.803 m)     Head Circumference      Peak Flow      Pain Score 8     Pain Loc      Pain Edu?      Excl. in GC?     Constitutional: Alert and oriented. Well appearing and in no acute distress. Eyes: Conjunctivae are normal. PERRL. EOMI. Head: Atraumatic. Ears:  Healthy appearing ear canals and TMs bilaterally Nose: No congestion/rhinnorhea. Mouth/Throat: Mucous membranes are moist.  Oropharynx non-erythematous. Neck: No stridor.  No meningeal signs.   Cardiovascular: Normal rate, regular rhythm. Good peripheral circulation. Grossly normal heart sounds. Respiratory: Normal respiratory effort.  No retractions. Lungs CTAB. Gastrointestinal: Soft and nontender. No distention.  Musculoskeletal: No lower extremity tenderness nor edema. No gross deformities of extremities. Neurologic:  Normal speech and language. No gross focal neurologic deficits are appreciated.  Skin:  Skin is warm, dry and intact. No rash noted. Psychiatric: Mood and affect are normal. Speech and  behavior are normal.  ____________________________________________   LABS (all labs ordered are listed, but only abnormal results are displayed)  Labs Reviewed  INFLUENZA PANEL BY PCR (TYPE A & B)     RADIOLOGY I, [place] Yisroel Mullendore, personally viewed and evaluated these images (plain radiographs) as part of my medical decision making, as well as reviewing the written report by the radiologist.  Dg Chest 2 View  Result Date: 02/10/2016 CLINICAL DATA:  Cough, fever, body  aches, nausea and vomiting, and congestion starting on Friday. EXAM: CHEST  2 VIEW COMPARISON:  11/01/2013 FINDINGS: Airway thickening is present, suggesting bronchitis or reactive airways disease. No discrete airspace opacity. No pleural effusion. Cardiac and mediastinal margins appear normal. IMPRESSION: 1. Airway thickening is present, suggesting bronchitis or reactive airways disease. Electronically Signed   By: Gaylyn Rong M.D.   On: 02/10/2016 12:14     Procedures    INITIAL IMPRESSION / ASSESSMENT AND PLAN / ED COURSE  Pertinent labs & imaging results that were available during my care of the patient were reviewed by me and considered in my medical decision making (see chart for details).  Patient history physical exam consistent with influenza which was confirmed with swab.      ____________________________________________  FINAL CLINICAL IMPRESSION(S) / ED DIAGNOSES  Final diagnoses:  Influenza     MEDICATIONS GIVEN DURING THIS VISIT:  Medications  sodium chloride 0.9 % bolus 1,000 mL (not administered)  sodium chloride 0.9 % bolus 1,000 mL (not administered)  ibuprofen (ADVIL,MOTRIN) tablet 800 mg (not administered)  acetaminophen (TYLENOL) tablet 650 mg (650 mg Oral Given 02/10/16 1147)     NEW OUTPATIENT MEDICATIONS STARTED DURING THIS VISIT:  New Prescriptions   No medications on file    Modified Medications   No medications on file    Discontinued Medications   No medications on file     Note:  This document was prepared using Dragon voice recognition software and may include unintentional dictation errors.    Darci Current, MD 02/13/16 [phone]

## 2016-02-10 NOTE — ED Notes (Signed)
See triage note  Body aches cough and fever since Friday febrile on arrival

## 2016-02-10 NOTE — ED Notes (Signed)
AAOx3.  Skin warm and dry.  NAD 

## 2016-02-10 NOTE — ED Triage Notes (Signed)
Pt come into the ED via POV c/o cough, fever,  body aches, N/V, and congestion.  Patient states the symptoms started on Friday.  Patient presents tachycardic and with a fever at this time.  Patient able to ambulate with no difficulty to the room and with no shortness of breath present at this time.

## 2018-05-17 ENCOUNTER — Ambulatory Visit (INDEPENDENT_AMBULATORY_CARE_PROVIDER_SITE_OTHER): Payer: Managed Care, Other (non HMO)

## 2018-05-17 ENCOUNTER — Ambulatory Visit
Admission: EM | Admit: 2018-05-17 | Discharge: 2018-05-17 | Disposition: A | Discharge status: Home / Self Care | Payer: Managed Care, Other (non HMO) | Attending: Family Medicine | Admitting: Family Medicine

## 2018-05-17 ENCOUNTER — Encounter: Payer: Self-pay | Admitting: Emergency Medicine

## 2018-05-17 ENCOUNTER — Other Ambulatory Visit: Payer: Self-pay

## 2018-05-17 DIAGNOSIS — M25562 Pain in left knee: Secondary | ICD-10-CM

## 2018-05-17 MED ORDER — MELOXICAM 15 MG PO TABS: 15.0000 mg | ORAL_TABLET | Freq: Every day | ORAL | 0 refills | Status: DC | PRN

## 2018-05-17 NOTE — ED Triage Notes (Signed)
Patient c/o left knee pain for 2 weeks.  Patient denies a specific injury.

## 2018-05-17 NOTE — Discharge Instructions (Signed)
Xray negative.  Rest, ice, elevation.  Medication as needed.  If persists, see ortho.  Take care  Dr. Adriana Simas

## 2018-05-19 NOTE — ED Provider Notes (Signed)
[facility]    CSN: [id number] Arrival date & time: 05/17/18  1608  History   Chief Complaint Chief Complaint  Patient presents with  . Knee Pain    left   HPI  53 year old female presents with left knee pain.  Patient reports a 2-week history of left knee pain.  Pain is located diffusely throughout the knee.  No reports of swelling.  Patient states that she believes that this has been caused by a change in her activity level.  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  This is a new activity for her.  She has taken Aleve and used ice without resolution.  Worse with activity.  No relieving factors.  No recent fall, trauma, or injury.  Pain is currently 8/10 in severity.  No other associated symptoms.  No other complaints.  PMH, Surgical Hx, Family Hx, Social History reviewed and updated as below.  PMH: Cervical Dysplasia.  Surgical Hx: Vaginal hysterectomy.  Home Medications    Prior to Admission medications   Medication Sig Start Date End Date Taking? Authorizing Provider  fluticasone (FLONASE) 50 MCG/ACT nasal spray Place 2 sprays into both nostrils daily. 03/23/15 05/17/18 Yes Summers, Rhonda L, PA-C  loratadine (CLARITIN) 10 MG tablet Take 1 tablet (10 mg total) by mouth daily. 05/16/15   Hagler, Jami L, PA-C  meloxicam (MOBIC) 15 MG tablet Take 1 tablet (15 mg total) by mouth daily as needed for pain. 05/17/18   Tommie Sams, DO   Family History Family History  Problem Relation Age of Onset  . Diabetes Mother   . Hypertension Mother   . Diabetes Father   . Hypertension Father     Social History Social History   Tobacco Use  . Smoking status: Current Some Day Smoker    Packs/day: 0.50    Types: Cigarettes  . Smokeless tobacco: Never Used  Substance Use Topics  . Alcohol use: Yes    Comment: occ  . Drug use: No   Allergies   Patient has no known allergies.   Review of Systems Review of Systems  Constitutional: Negative.    Musculoskeletal:       Left knee pain.   Physical Exam Triage Vital Signs ED Triage Vitals  Enc Vitals Group     BP 05/17/18 1702 120/74     Pulse Rate 05/17/18 1702 91     Resp 05/17/18 1702 16     Temp 05/17/18 1702 98.5 F (36.9 C)     Temp Source 05/17/18 1702 Oral     SpO2 05/17/18 1702 99 %     Weight 05/17/18 1658 250 lb (113.4 kg)     Height 05/17/18 1658 5\' 11"  (1.803 m)     Head Circumference --      Peak Flow --      Pain Score 05/17/18 1658 8     Pain Loc --      Pain Edu? --      Excl. in GC? --    Updated Vital Signs BP 120/74 (BP Location: Left Arm)   Pulse 91   Temp 98.5 F (36.9 C) (Oral)   Resp 16   Ht 5\' 11"  (1.803 m)   Wt 113.4 kg   LMP  (LMP Unknown)   SpO2 99%   BMI 34.87 kg/m   Visual Acuity Right Eye Distance:   Left Eye Distance:   Bilateral Distance:    Right Eye Near:   Left  Eye Near:    Bilateral Near:     Physical Exam Vitals signs and nursing note reviewed.  Constitutional:      General: She is not in acute distress.    Appearance: Normal appearance.  HENT:     Head: Normocephalic and atraumatic.  Eyes:     General:        Right eye: No discharge.        Left eye: No discharge.     Conjunctiva/sclera: Conjunctivae normal.  Pulmonary:     Effort: Pulmonary effort is normal. No respiratory distress.  Musculoskeletal:     Comments: Left knee - No swelling/effusion. No erythema.  Ligaments intact.  No discrete areas of tenderness.  Neurological:     Mental Status: She is alert.  Psychiatric:        Mood and Affect: Mood normal.        Behavior: Behavior normal.    UC Treatments / Results  Labs (all labs ordered are listed, but only abnormal results are displayed) Labs Reviewed - No data to display  EKG None  Radiology Dg Knee Complete 4 Views Left  Result Date: 05/17/2018 CLINICAL DATA:  Knee pain for 2 weeks, no known injury, initial encounter EXAM: LEFT KNEE - COMPLETE 4+ VIEW COMPARISON:  None. FINDINGS: No  evidence of fracture, dislocation, or joint effusion. No evidence of arthropathy or other focal bone abnormality. Soft tissues are unremarkable. IMPRESSION: No acute abnormality noted. Electronically Signed   By: Alcide CleverMark  Lukens M.D.   On: 05/17/2018 17:56    Procedures Procedures (including critical care time)  Medications Ordered in UC Medications - No data to display  Initial Impression / Assessment and Plan / UC Course  I have reviewed the triage vital signs and the nursing notes.  Pertinent labs & imaging results that were available during my care of the patient were reviewed by me and considered in my medical decision making (see chart for details).    [id number] year old female presents with left knee pain.  X-ray negative.  Treating conservatively with meloxicam.  Final Clinical Impressions(s) / UC Diagnoses   Final diagnoses:  Acute pain of left knee     Discharge Instructions     Xray negative.  Rest, ice, elevation.  Medication as needed.  If persists, see ortho.  Take care  Dr. Adriana Simasook    ED Prescriptions    Medication Sig Dispense Auth. Provider   meloxicam (MOBIC) 15 MG tablet Take 1 tablet (15 mg total) by mouth daily as needed for pain. 30 tablet Tommie Samsook, Jaylanie Boschee G, DO     Controlled Substance Prescriptions [place] Controlled Substance Registry consulted? Not Applicable   Tommie SamsCook, Jamiee Milholland G, [place] 05/19/18 [phone]

## 2018-08-24 ENCOUNTER — Other Ambulatory Visit: Payer: Self-pay | Admitting: Family Medicine

## 2018-08-24 DIAGNOSIS — Z1231 Encounter for screening mammogram for malignant neoplasm of breast: Secondary | ICD-10-CM

## 2018-10-29 ENCOUNTER — Ambulatory Visit
Admission: RE | Admit: 2018-10-29 | Discharge: 2018-10-29 | Disposition: A | Discharge status: Home / Self Care | Payer: Managed Care, Other (non HMO) | Source: Ambulatory Visit | Attending: Family Medicine | Admitting: Family Medicine

## 2018-10-29 DIAGNOSIS — Z1231 Encounter for screening mammogram for malignant neoplasm of breast: Secondary | ICD-10-CM | POA: Insufficient documentation

## 2018-11-04 ENCOUNTER — Other Ambulatory Visit: Payer: Self-pay | Admitting: Family Medicine

## 2018-11-04 DIAGNOSIS — N6320 Unspecified lump in the left breast, unspecified quadrant: Secondary | ICD-10-CM

## 2018-11-04 DIAGNOSIS — R928 Other abnormal and inconclusive findings on diagnostic imaging of breast: Secondary | ICD-10-CM

## 2018-11-08 ENCOUNTER — Emergency Department
Admission: EM | Admit: 2018-11-08 | Discharge: 2018-11-08 | Disposition: A | Discharge status: Home / Self Care | Payer: Managed Care, Other (non HMO) | Attending: Emergency Medicine | Admitting: Emergency Medicine

## 2018-11-08 ENCOUNTER — Other Ambulatory Visit: Payer: Self-pay

## 2018-11-08 DIAGNOSIS — F17210 Nicotine dependence, cigarettes, uncomplicated: Secondary | ICD-10-CM | POA: Insufficient documentation

## 2018-11-08 DIAGNOSIS — N390 Urinary tract infection, site not specified: Secondary | ICD-10-CM | POA: Diagnosis not present

## 2018-11-08 DIAGNOSIS — R300 Dysuria: Secondary | ICD-10-CM | POA: Diagnosis present

## 2018-11-08 LAB — URINALYSIS, COMPLETE (UACMP) WITH MICROSCOPIC
Bilirubin Urine: NEGATIVE
Glucose, UA: NEGATIVE mg/dL
Ketones, ur: NEGATIVE mg/dL
Nitrite: POSITIVE — AB
Protein, ur: NEGATIVE mg/dL
Specific Gravity, Urine: 1.021 (ref 1.005–1.030)
WBC, UA: >50 WBC/hpf — ABNORMAL HIGH (ref 0–5)
pH: 6 (ref 5.0–8.0)

## 2018-11-08 MED ORDER — CEPHALEXIN 500 MG PO CAPS: 500.0000 mg | ORAL_CAPSULE | Freq: Three times a day (TID) | ORAL | 0 refills | Status: DC

## 2018-11-08 NOTE — ED Notes (Signed)
Pt c/o burning and pressure while urinating X1 week. No relief at home from azo

## 2018-11-08 NOTE — Discharge Instructions (Signed)
Follow-up with your primary care provider or [facility] acute [facility] if any continued problems.  Increase fluids.  Take antibiotics until completely finished.  You may also take Tylenol or ibuprofen if needed.  The Azo that you have been taking over-the-counter can still be taken with the antibiotic if needed.  If any worsening of your symptoms return to the emergency department.

## 2018-11-08 NOTE — ED Provider Notes (Signed)
[facility] Emergency Department Provider Note   ____________________________________________   First MD Initiated Contact with Patient 11/08/18 1643     (approximate)  I have reviewed the triage vital signs and the nursing notes.   HISTORY  Chief Complaint Urinary Tract Infection   HPI Gina Cannon is a 53 y.o. female presents to the ED with UTI symptoms for 1 week.  Patient states she has been taking Azo with some relief.  She denies any fever, chills, nausea or vomiting.  She denies any flank pain.  Patient states that it is been a very long time since her last UTI.  She rates her pain as a 4 out of 10.     Past Medical History:  Diagnosis Date  . Neuropathy     There are no active problems to display for this patient.   Past Surgical History:  Procedure Laterality Date  . ABDOMINAL HYSTERECTOMY      Prior to Admission medications   Medication Sig Start Date End Date Taking? Authorizing Provider  cephALEXin (KEFLEX) 500 MG capsule Take 1 capsule (500 mg total) by mouth 3 (three) times daily. 11/08/18   Johnn Hai, PA-C  fluticasone (FLONASE) 50 MCG/ACT nasal spray Place 2 sprays into both nostrils daily. 03/23/15 05/17/18  Johnn Hai, PA-C  loratadine (CLARITIN) 10 MG tablet Take 1 tablet (10 mg total) by mouth daily. 05/16/15   Hagler, Jami L, PA-C  meloxicam (MOBIC) 15 MG tablet Take 1 tablet (15 mg total) by mouth daily as needed for pain. 05/17/18   Coral Spikes, DO    Allergies Patient has no known allergies.  Family History  Problem Relation Age of Onset  . Diabetes Mother   . Hypertension Mother   . Diabetes Father   . Hypertension Father     Social History Social History   Tobacco Use  . Smoking status: Current Some Day Smoker    Packs/day: 0.50    Types: Cigarettes  . Smokeless tobacco: Never Used  Substance Use Topics  . Alcohol use: Yes    Comment: occ  . Drug use: No    Review of Systems  Constitutional: No fever/chills Cardiovascular: Denies chest pain. Respiratory: Denies shortness of breath. Gastrointestinal: No abdominal pain.  No nausea, no vomiting.   Genitourinary: Positive for frequency and dysuria. Musculoskeletal: Negative for back pain. Skin: Negative for rash. Neurological: Negative for headaches, focal weakness or numbness. ____________________________________________   PHYSICAL EXAM:  VITAL SIGNS: ED Triage Vitals  Enc Vitals Group     BP 11/08/18 1628 135/83     Pulse Rate 11/08/18 1628 91     Resp 11/08/18 1628 18     Temp 11/08/18 1628 98.9 F (37.2 C)     Temp src --      SpO2 11/08/18 1628 99 %     Weight 11/08/18 1627 245 lb (111.1 kg)     Height 11/08/18 1627 5\' 11"  (1.803 m)     Head Circumference --      Peak Flow --      Pain Score 11/08/18 1627 4     Pain Loc --      Pain Edu? --      Excl. in [place]? --      Constitutional: Alert and oriented. Well appearing and in no acute distress. Eyes: Conjunctivae are normal. PERRL. EOMI. Head: Atraumatic. Neck: No stridor.   Cardiovascular: Normal rate, regular rhythm. Grossly normal heart sounds.  Good peripheral  circulation. Respiratory: Normal respiratory effort.  No retractions. Lungs CTAB. Gastrointestinal: Soft and nontender. No distention.  No CVA tenderness. Musculoskeletal: Moves upper and lower extremities and normal gait was noted. Neurologic:  Normal speech and language. No gross focal neurologic deficits are appreciated. No gait instability. Skin:  Skin is warm, dry and intact. No rash noted. Psychiatric: Mood and affect are normal. Speech and behavior are normal.  ____________________________________________   LABS (all labs ordered are listed, but only abnormal results are displayed)  Labs Reviewed  URINALYSIS, COMPLETE (UACMP) WITH MICROSCOPIC - Abnormal; Notable for the following components:      Result Value   Color, Urine AMBER (*)    APPearance HAZY (*)    Hgb  urine dipstick SMALL (*)    Nitrite POSITIVE (*)    Leukocytes,Ua MODERATE (*)    WBC, UA >50 (*)    Bacteria, UA MANY (*)    All other components within normal limits     PROCEDURES  Procedure(s) performed (including Critical Care):  Procedures  ____________________________________________   INITIAL IMPRESSION / ASSESSMENT AND PLAN / ED COURSE  As part of my medical decision making, I reviewed the following data within the electronic MEDICAL RECORD NUMBER Notes from prior ED visits and [place] Controlled Substance Database   53 year old female presents to the ED with complaint of dysuria for 1 week.  Patient denies any fever, chills, nausea or vomiting.  She denies any flank pain.  She has been taking Azo over-the-counter with some minimal relief.  Urinalysis is consistent with an acute UTI.  Patient was placed on Keflex 500 mg 3 times daily for 10 days.  She is to follow-up with her PCP if any continued problems and increase fluids.  She was told that she could continue taking the Azo with the antibiotic if needed.  ____________________________________________   FINAL CLINICAL IMPRESSION(S) / ED DIAGNOSES  Final diagnoses:  Acute urinary tract infection     ED Discharge Orders         Ordered    cephALEXin (KEFLEX) 500 MG capsule  3 times daily     11/08/18 1711           Note:  This document was prepared using Dragon voice recognition software and may include unintentional dictation errors.    Tommi Rumps, PA-C 11/08/18 Julio Sicks    Sharyn Creamer, MD 11/11/18 [phone]

## 2018-11-08 NOTE — ED Triage Notes (Signed)
Pt comes via POV from home with c/o UTI. Pt states this has been going on for about a week.  Pt states burning and pain with urination. Pt states she has taken AZO with little relief.

## 2018-11-22 ENCOUNTER — Ambulatory Visit
Admission: RE | Admit: 2018-11-22 | Discharge: 2018-11-22 | Disposition: A | Discharge status: Home / Self Care | Payer: Managed Care, Other (non HMO) | Source: Ambulatory Visit | Attending: Family Medicine | Admitting: Family Medicine

## 2018-11-22 DIAGNOSIS — R928 Other abnormal and inconclusive findings on diagnostic imaging of breast: Secondary | ICD-10-CM

## 2018-11-22 DIAGNOSIS — N6320 Unspecified lump in the left breast, unspecified quadrant: Secondary | ICD-10-CM

## 2019-03-04 ENCOUNTER — Encounter: Payer: Self-pay | Admitting: Emergency Medicine

## 2019-03-04 ENCOUNTER — Emergency Department
Admission: EM | Admit: 2019-03-04 | Discharge: 2019-03-04 | Disposition: A | Discharge status: Home / Self Care | Payer: HRSA Program | Attending: Emergency Medicine | Admitting: Emergency Medicine

## 2019-03-04 ENCOUNTER — Other Ambulatory Visit: Payer: Self-pay

## 2019-03-04 DIAGNOSIS — F17210 Nicotine dependence, cigarettes, uncomplicated: Secondary | ICD-10-CM | POA: Insufficient documentation

## 2019-03-04 DIAGNOSIS — U071 COVID-19: Secondary | ICD-10-CM | POA: Diagnosis not present

## 2019-03-04 DIAGNOSIS — Z79899 Other long term (current) drug therapy: Secondary | ICD-10-CM | POA: Insufficient documentation

## 2019-03-04 DIAGNOSIS — J0101 Acute recurrent maxillary sinusitis: Secondary | ICD-10-CM | POA: Insufficient documentation

## 2019-03-04 DIAGNOSIS — Z20822 Contact with and (suspected) exposure to covid-19: Secondary | ICD-10-CM

## 2019-03-04 DIAGNOSIS — R5381 Other malaise: Secondary | ICD-10-CM | POA: Diagnosis present

## 2019-03-04 MED ORDER — AMOXICILLIN-POT CLAVULANATE 875-125 MG PO TABS: 1.0000 | ORAL_TABLET | Freq: Two times a day (BID) | ORAL | 0 refills | Status: DC

## 2019-03-04 MED ORDER — AMOXICILLIN-POT CLAVULANATE 875-125 MG PO TABS
1.0000 | ORAL_TABLET | Freq: Once | ORAL | Status: AC
Start: 2019-03-04 — End: 2019-03-04
  Administered 2019-03-04: 1 via ORAL
  Filled 2019-03-04: qty 1

## 2019-03-04 NOTE — ED Provider Notes (Signed)
[facility] Emergency Department Provider Note  ____________________________________________  Time seen: Approximately 4:28 PM  I have reviewed the triage vital signs and the nursing notes.   HISTORY  Chief Complaint COVID sx    HPI Gina Cannon is a 54 y.o. female who presents the emergency department complaining of general malaise, low-grade fever, nasal congestion, sinus pressure, loss of taste and smell, slight cough, body aches.  Patient denies any close contact with COVID-19.  Patient states that she is unsure whether she may have Covid versus a sinus infection.  She states that she does have some similar symptoms with her regular sinus infections, but typically does not have cough, body aches.  Patient denies any visual changes, neck pain or stiffness, chest pain, shortness of breath, abdominal pain, nausea or vomiting.  Patient is taken multiple over-the-counter medications with no relief of symptoms.  Patient states that symptoms have been ongoing x3 days.         Past Medical History:  Diagnosis Date  . Neuropathy     There are no problems to display for this patient.   Past Surgical History:  Procedure Laterality Date  . ABDOMINAL HYSTERECTOMY      Prior to Admission medications   Medication Sig Start Date End Date Taking? Authorizing Provider  amoxicillin-clavulanate (AUGMENTIN) 875-125 MG tablet Take 1 tablet by mouth 2 (two) times daily. 03/04/19   Kisa Fujii, Delorise Royals, PA-C  cephALEXin (KEFLEX) 500 MG capsule Take 1 capsule (500 mg total) by mouth 3 (three) times daily. 11/08/18   Tommi Rumps, PA-C  fluticasone (FLONASE) 50 MCG/ACT nasal spray Place 2 sprays into both nostrils daily. 03/23/15 05/17/18  Tommi Rumps, PA-C  loratadine (CLARITIN) 10 MG tablet Take 1 tablet (10 mg total) by mouth daily. 05/16/15   Hagler, Jami L, PA-C  meloxicam (MOBIC) 15 MG tablet Take 1 tablet (15 mg total) by mouth daily as needed for pain.  05/17/18   Tommie Sams, DO    Allergies Patient has no known allergies.  Family History  Problem Relation Age of Onset  . Diabetes Mother   . Hypertension Mother   . Diabetes Father   . Hypertension Father     Social History Social History   Tobacco Use  . Smoking status: Current Some Day Smoker    Packs/day: 0.50    Types: Cigarettes  . Smokeless tobacco: Never Used  Substance Use Topics  . Alcohol use: Yes    Comment: occ  . Drug use: No     Review of Systems  Constitutional: Subjective low-grade fever/chills.  Positive for body aches. Eyes: No visual changes. No discharge ENT: Positive for sinus congestion, sinus pressure Cardiovascular: no chest pain. Respiratory: Positive cough. No SOB. Gastrointestinal: No abdominal pain.  No nausea, no vomiting.  No diarrhea.  No constipation. Musculoskeletal: Negative for musculoskeletal pain. Skin: Negative for rash, abrasions, lacerations, ecchymosis. Neurological: Negative for headaches, focal weakness or numbness. 10-point ROS otherwise negative.  ____________________________________________   PHYSICAL EXAM:  VITAL SIGNS: ED Triage Vitals  Enc Vitals Group     BP 03/04/19 1529 127/70     Pulse Rate 03/04/19 1526 80     Resp 03/04/19 1526 16     Temp 03/04/19 1526 98.9 F (37.2 C)     Temp Source 03/04/19 1526 Oral     SpO2 03/04/19 1526 98 %     Weight 03/04/19 1527 245 lb (111.1 kg)     Height 03/04/19 1527 5'  11" (1.803 m)     Head Circumference --      Peak Flow --      Pain Score 03/04/19 1527 7     Pain Loc --      Pain Edu? --      Excl. in [place]? --      Constitutional: Alert and oriented. Well appearing and in no acute distress. Eyes: Conjunctivae are normal. PERRL. EOMI. Head: Atraumatic. ENT:      Ears: EACs and TMs unremarkable bilaterally.      Nose: No significant congestion/rhinnorhea.  Patient is tender to percussion over the frontal and maxillary sinuses.      Mouth/Throat: Mucous  membranes are moist.  Oropharynx is nonerythematous and nonedematous.  Uvula is midline. Neck: No stridor.  Neck is supple full range of motion Hematological/Lymphatic/Immunilogical: No cervical lymphadenopathy. Cardiovascular: Normal rate, regular rhythm. Normal S1 and S2.  Good peripheral circulation. Respiratory: Normal respiratory effort without tachypnea or retractions. Lungs CTAB. Good air entry to the bases with no decreased or absent breath sounds. Musculoskeletal: Full range of motion to all extremities. No gross deformities appreciated. Neurologic:  Normal speech and language. No gross focal neurologic deficits are appreciated.  Skin:  Skin is warm, dry and intact. No rash noted. Psychiatric: Mood and affect are normal. Speech and behavior are normal. Patient exhibits appropriate insight and judgement.   ____________________________________________   LABS (all labs ordered are listed, but only abnormal results are displayed)  Labs Reviewed  SARS CORONAVIRUS 2 (TAT 6-24 HRS)   ____________________________________________  EKG   ____________________________________________  RADIOLOGY   No results found.  ____________________________________________    PROCEDURES  Procedure(s) performed:    Procedures    Medications  amoxicillin-clavulanate (AUGMENTIN) 875-125 MG per tablet 1 tablet (has no administration in time range)     ____________________________________________   INITIAL IMPRESSION / ASSESSMENT AND PLAN / ED COURSE  Pertinent labs & imaging results that were available during my care of the patient were reviewed by me and considered in my medical decision making (see chart for details).  Review of the [place] CSRS was performed in accordance of the [facility] prior to dispensing any controlled drugs.           Patient's diagnosis is consistent with suspected COVID-19, sinusitis.  Patient presented to emergency department complaining of multiple  complaints, mostly in regards to upper respiratory type infection.  Patient does have a history of recurrent sinus infections, does have tenderness to percussion over the sinuses.  However patient also has headache, body aches, nasal congestion, sinus pressure, sore throat and cough.  No evidence of strep on exam.  Lungs are clear to auscultation.  At this time, patient will be tested for COVID-19.  Given patient's history of recurrent sinusitis, as well as findings on physical exam consistent with sinusitis patient will be started on antibiotics.  I have advised the patient that if she receives a positive result, symptoms are most likely consistent with COVID-19 and to stop her antibiotics.  The patient has a negative result, continue with antibiotic usage.  Tylenol, Motrin for additional symptom relief.  Plenty of fluids and rest.  Follow-up with primary care as needed..  Patient is given ED precautions to return to the ED for any worsening or new symptoms.     ____________________________________________  FINAL CLINICAL IMPRESSION(S) / ED DIAGNOSES  Final diagnoses:  Suspected COVID-19 virus infection  Acute recurrent maxillary sinusitis      NEW MEDICATIONS STARTED DURING THIS VISIT:  ED Discharge Orders         Ordered    amoxicillin-clavulanate (AUGMENTIN) 875-125 MG tablet  2 times daily     03/04/19 1653              This chart was dictated using voice recognition software/Dragon. Despite best efforts to proofread, errors can occur which can change the meaning. Any change was purely unintentional.    Darletta Moll, PA-C 03/04/19 1654    Earleen Newport, MD 03/04/19 [phone]

## 2019-03-04 NOTE — ED Triage Notes (Signed)
Pt to ED via POV c/o Headache, loss of taste and smell, sinus pressure, and nausea since Tuesday. Pt states that she does not feel well. Pt is in NAD

## 2019-03-05 LAB — SARS CORONAVIRUS 2 (TAT 6-24 HRS): SARS Coronavirus 2: POSITIVE — AB

## 2019-03-07 ENCOUNTER — Telehealth: Payer: Self-pay | Admitting: Emergency Medicine

## 2019-03-07 NOTE — Telephone Encounter (Signed)
Called to assure patient is aware of covid positive.  She is aware as she saw on her my chart.  Discussed isolation and she has no questions.

## 2020-07-31 ENCOUNTER — Emergency Department: Payer: Self-pay

## 2020-07-31 ENCOUNTER — Other Ambulatory Visit: Payer: Self-pay

## 2020-07-31 ENCOUNTER — Emergency Department
Admission: EM | Admit: 2020-07-31 | Discharge: 2020-07-31 | Disposition: A | Discharge status: Home / Self Care | Payer: Self-pay | Attending: Emergency Medicine | Admitting: Emergency Medicine

## 2020-07-31 DIAGNOSIS — K2100 Gastro-esophageal reflux disease with esophagitis, without bleeding: Secondary | ICD-10-CM | POA: Insufficient documentation

## 2020-07-31 DIAGNOSIS — M79604 Pain in right leg: Secondary | ICD-10-CM | POA: Insufficient documentation

## 2020-07-31 DIAGNOSIS — F17210 Nicotine dependence, cigarettes, uncomplicated: Secondary | ICD-10-CM | POA: Insufficient documentation

## 2020-07-31 LAB — CBC WITH DIFFERENTIAL/PLATELET
Abs Immature Granulocytes: 0.03 10*3/uL (ref 0.00–0.07)
Basophils Absolute: 0 10*3/uL (ref 0.0–0.1)
Basophils Relative: 0 %
Eosinophils Absolute: 0.1 10*3/uL (ref 0.0–0.5)
Eosinophils Relative: 1 %
HCT: 41.4 % (ref 36.0–46.0)
Hemoglobin: 13.1 g/dL (ref 12.0–15.0)
Immature Granulocytes: 0 %
Lymphocytes Relative: 33 %
Lymphs Abs: 3 10*3/uL (ref 0.7–4.0)
MCH: 29.2 pg (ref 26.0–34.0)
MCHC: 31.6 g/dL (ref 30.0–36.0)
MCV: 92.4 fL (ref 80.0–100.0)
Monocytes Absolute: 0.5 10*3/uL (ref 0.1–1.0)
Monocytes Relative: 6 %
Neutro Abs: 5.4 10*3/uL (ref 1.7–7.7)
Neutrophils Relative %: 60 %
Platelets: 190 10*3/uL (ref 150–400)
RBC: 4.48 MIL/uL (ref 3.87–5.11)
RDW: 14.1 % (ref 11.5–15.5)
WBC: 9.1 10*3/uL (ref 4.0–10.5)
nRBC: 0 % (ref 0.0–0.2)

## 2020-07-31 LAB — COMPREHENSIVE METABOLIC PANEL
ALT: 26 U/L (ref 0–44)
AST: 27 U/L (ref 15–41)
Albumin: 3.8 g/dL (ref 3.5–5.0)
Alkaline Phosphatase: 66 U/L (ref 38–126)
Anion gap: 4 — ABNORMAL LOW (ref 5–15)
BUN: 12 mg/dL (ref 6–20)
CO2: 32 mmol/L (ref 22–32)
Calcium: 9.2 mg/dL (ref 8.9–10.3)
Chloride: 103 mmol/L (ref 98–111)
Creatinine, Ser: 0.65 mg/dL (ref 0.44–1.00)
GFR, Estimated: >60 mL/min (ref >60)
Glucose, Bld: 105 mg/dL — ABNORMAL HIGH (ref 70–99)
Potassium: 4.2 mmol/L (ref 3.5–5.1)
Sodium: 139 mmol/L (ref 135–145)
Total Bilirubin: 0.5 mg/dL (ref 0.3–1.2)
Total Protein: 7.4 g/dL (ref 6.5–8.1)

## 2020-07-31 LAB — TROPONIN I (HIGH SENSITIVITY): Troponin I (High Sensitivity): 3 ng/L (ref <18)

## 2020-07-31 MED ORDER — ALUM & MAG HYDROXIDE-SIMETH 200-200-20 MG/5ML PO SUSP
30.0000 mL | Freq: Once | ORAL | Status: AC
  Administered 2020-07-31: 30 mL via ORAL
  Filled 2020-07-31: qty 30

## 2020-07-31 MED ORDER — LIDOCAINE VISCOUS HCL 2 % MT SOLN
15.0000 mL | Freq: Once | OROMUCOSAL | Status: AC
  Administered 2020-07-31: 15 mL via ORAL
  Filled 2020-07-31: qty 15

## 2020-07-31 NOTE — ED Triage Notes (Signed)
Pt here with heartburn for a few days that has increased in pain. Pt also has knot to the inside of her right leg but denies injury. Pt denies any other symptoms.

## 2020-07-31 NOTE — ED Provider Notes (Signed)
[facility] Emergency Department Provider Note  ____________________________________________   Event Date/Time   First MD Initiated Contact with Patient 07/31/20 1539     (approximate)  I have reviewed the triage vital signs and the nursing notes.   HISTORY  Chief Complaint Heartburn and Leg Pain  HPI Gina Cannon is a 55 y.o. female who presents to the emergency department for multiple medical complaints.  First complaint, patient is having substernal chest pain that she describes as burning.  This has been present for the last 2 days.  She reports that it feels similar to reflux that she has had intermittently, however has never had it this severely or persistently.  She reports trying OTC medications at home that typically help her, however reports that they are not helping this time.  She denies any shortness of breath, cough, dizziness, palpitations.  She denies any changes in diet, nausea, vomiting, abdominal pain, constipation or diarrhea.  Denies any fevers.  Patient is also complaining of a hard knot on her right leg that has been present for the last 2 to 3 weeks without any trauma.  She denies any foot or leg swelling related to this, however has noticed some skin color changes.  She does not take any blood thinners, has not had any paresthesias or weakness.         Past Medical History:  Diagnosis Date   Neuropathy     There are no problems to display for this patient.   Past Surgical History:  Procedure Laterality Date   ABDOMINAL HYSTERECTOMY      Prior to Admission medications   Medication Sig Start Date End Date Taking? Authorizing Provider  amoxicillin-clavulanate (AUGMENTIN) 875-125 MG tablet Take 1 tablet by mouth 2 (two) times daily. 03/04/19   Cuthriell, Delorise Royals, PA-C  cephALEXin (KEFLEX) 500 MG capsule Take 1 capsule (500 mg total) by mouth 3 (three) times daily. 11/08/18   Tommi Rumps, PA-C  fluticasone (FLONASE)  50 MCG/ACT nasal spray Place 2 sprays into both nostrils daily. 03/23/15 05/17/18  Tommi Rumps, PA-C  loratadine (CLARITIN) 10 MG tablet Take 1 tablet (10 mg total) by mouth daily. 05/16/15   Hagler, Jami L, PA-C  meloxicam (MOBIC) 15 MG tablet Take 1 tablet (15 mg total) by mouth daily as needed for pain. 05/17/18   Tommie Sams, DO    Allergies Patient has no known allergies.  Family History  Problem Relation Age of Onset   Diabetes Mother    Hypertension Mother    Diabetes Father    Hypertension Father     Social History Social History   Tobacco Use   Smoking status: Some Days    Packs/day: 0.50    Types: Cigarettes   Smokeless tobacco: Never  Vaping Use   Vaping Use: Never used  Substance Use Topics   Alcohol use: Yes    Comment: occ   Drug use: No    Review of Systems Constitutional: No fever/chills Eyes: No visual changes. ENT: No sore throat. Cardiovascular: + chest pain. Respiratory: Denies shortness of breath. Gastrointestinal: No abdominal pain.  No nausea, no vomiting.  No diarrhea.  No constipation. Genitourinary: Negative for dysuria. Musculoskeletal: + Right leg pain Skin: Negative for rash. Neurological: Negative for headaches, focal weakness or numbness.   ____________________________________________   PHYSICAL EXAM:  VITAL SIGNS: ED Triage Vitals  Enc Vitals Group     BP 07/31/20 1425 136/76     Pulse Rate 07/31/20  1425 82     Resp 07/31/20 1425 18     Temp 07/31/20 1425 98.3 F (36.8 C)     Temp Source 07/31/20 1425 Oral     SpO2 07/31/20 1425 99 %     Weight --      Height --      Head Circumference --      Peak Flow --      Pain Score 07/31/20 1436 7     Pain Loc --      Pain Edu? --      Excl. in [place]? --    Constitutional: Alert and oriented. Well appearing and in no acute distress. Eyes: Conjunctivae are normal. PERRL. EOMI. Head: Atraumatic. Nose: No congestion/rhinnorhea. Mouth/Throat: Mucous membranes are moist.   Oropharynx non-erythematous. Neck: No stridor.   Cardiovascular: Normal rate, regular rhythm. Grossly normal heart sounds.  Good peripheral circulation. Respiratory: Normal respiratory effort.  No retractions. Lungs CTAB. Gastrointestinal: Soft and nontender. No distention. No abdominal bruits. No CVA tenderness. Musculoskeletal: There is tenderness on the medial distal right leg, just above the ankle joint.  At the site of this tenderness is a palpable hardening measure approximately 2 cm x 2 cm.  No fluctuance noted.  There is some hyperpigmented skin just below this, however no erythema or ecchymosis.  There is no tenderness or swelling of the calf.  Dorsal pedal pulses 2+ bilaterally.  Full range of motion of the right knee and ankle without difficulty. Neurologic:  Normal speech and language. No gross focal neurologic deficits are appreciated. No gait instability. Skin:  Skin is warm, dry and intact. No rash noted. Psychiatric: Mood and affect are normal. Speech and behavior are normal.  ____________________________________________   LABS (all labs ordered are listed, but only abnormal results are displayed)  Labs Reviewed  COMPREHENSIVE METABOLIC PANEL - Abnormal; Notable for the following components:      Result Value   Glucose, Bld 105 (*)    Anion gap 4 (*)    All other components within normal limits  CBC WITH DIFFERENTIAL/PLATELET  TROPONIN I (HIGH SENSITIVITY)  TROPONIN I (HIGH SENSITIVITY)   ____________________________________________  EKG  Normal sinus rhythm with a rate of 69 bpm.  Normal QTC.  Normal axis.  No ST segment changes to suggest acute ischemia. ____________________________________________  RADIOLOGY I, Lucy Chris, personally viewed and evaluated these images (plain radiographs) as part of my medical decision making, as well as reviewing the written report by the radiologist.  ED provider interpretation: Chest x-ray reviewed and agree with  radiology there is no active pneumonia or other acute process; see radiology report for ultrasound findings  Official radiology report(s): DG Chest 2 View  Result Date: 07/31/2020 CLINICAL DATA:  right leg swelling, chest pain EXAM: CHEST - 2 VIEW COMPARISON:  Chest x-ray 02/10/2016, CT chest 11/01/2013 FINDINGS: The heart size and mediastinal contours are within normal limits. No focal consolidation. No pulmonary edema. No pleural effusion. No pneumothorax. No acute osseous abnormality. IMPRESSION: No active cardiopulmonary disease. Electronically Signed   By: Tish Frederickson M.D.   On: 07/31/2020 16:42   US Venous Img Lower Unilateral Right  Result Date: 07/31/2020 CLINICAL DATA:  Right leg swelling. EXAM: RIGHT LOWER EXTREMITY VENOUS DOPPLER ULTRASOUND TECHNIQUE: Gray-scale sonography with compression, as well as color and duplex ultrasound, were performed to evaluate the deep venous system(s) from the level of the common femoral vein through the popliteal and proximal calf veins. COMPARISON:  None. FINDINGS: VENOUS Normal compressibility  of the common femoral, superficial femoral, and popliteal veins, as well as the visualized calf veins. Visualized portions of profunda femoral vein and great saphenous vein unremarkable. No filling defects to suggest DVT on grayscale or color Doppler imaging. Doppler waveforms show normal direction of venous flow, normal respiratory plasticity and response to augmentation. Limited views of the contralateral common femoral vein are unremarkable. IMPRESSION: No evidence of DVT in the right lower extremity. Electronically Signed   By: Feliberto Harts MD   On: 07/31/2020 18:19     ____________________________________________   INITIAL IMPRESSION / ASSESSMENT AND PLAN / ED COURSE  As part of my medical decision making, I reviewed the following data within the electronic MEDICAL RECORD NUMBER Nursing notes reviewed and incorporated, Radiograph reviewed, and Notes from  prior ED visits        Patient is a 55 year old female who presents to the emergency department for evaluation of multiple complaints including chest pain and right leg pain.  See HPI for further details.  In triage patient has normal vital signs.  Physical exam as above.  In regards to the patient's chest pain over the last 2 days that feels like a burning sensation, labs were obtained including a CBC, CMP and troponin.  These are all grossly within normal limits.  Second troponin not indicated given chest pain greater than 4 hours.  Chest x-ray was obtained and is negative for any acute pathology.  EKG within normal limits.  Patient was given a GI cocktail with Maalox with lidocaine and reports significant improvement in her symptoms with this.  Likely cause is reflux given her improvement.  We will initiate the patient on a course of Pepcid outpatient, with close PCP follow-up.  In regards to the right leg pain, there is no erythema, fluctuance or edema to suggest infection or hematoma.  Ultrasound was obtained to rule out DVT and this is negative.  We will have the patient provide supportive care including ice to the area and follow-up with orthopedics if not improving.  Return precautions were discussed for both of the above complaints and patient is agreeable with current plan of care.  She stable at this time for outpatient follow-up.      ____________________________________________   FINAL CLINICAL IMPRESSION(S) / ED DIAGNOSES  Final diagnoses:  Gastroesophageal reflux disease with esophagitis without hemorrhage  Right leg pain     ED Discharge Orders     None        Note:  This document was prepared using Dragon voice recognition software and may include unintentional dictation errors.    Lucy Chris, PA 07/31/20 1857    Merwyn Katos, MD 07/31/20 [phone]

## 2020-07-31 NOTE — Discharge Instructions (Addendum)
For your reflux, you may take 20mg  of Pepcid twice daily for 14 days, then take 20mg  once daily. Follow up with your PCP if this worsens or does not improve.  Please follow-up with orthopedics as above regarding your right leg pain.  If you develop any worsening, changes in your chest pain, shortness of breath then please return to the emergency department.

## 2021-04-17 DIAGNOSIS — G8929 Other chronic pain: Secondary | ICD-10-CM | POA: Diagnosis not present

## 2021-04-17 DIAGNOSIS — M7712 Lateral epicondylitis, left elbow: Secondary | ICD-10-CM | POA: Diagnosis not present

## 2021-04-17 DIAGNOSIS — M25522 Pain in left elbow: Secondary | ICD-10-CM | POA: Diagnosis not present

## 2021-05-15 DIAGNOSIS — Z7189 Other specified counseling: Secondary | ICD-10-CM | POA: Diagnosis not present

## 2021-05-15 DIAGNOSIS — M7710 Lateral epicondylitis, unspecified elbow: Secondary | ICD-10-CM | POA: Diagnosis not present

## 2021-05-15 DIAGNOSIS — Z1389 Encounter for screening for other disorder: Secondary | ICD-10-CM | POA: Diagnosis not present

## 2021-05-15 DIAGNOSIS — Z23 Encounter for immunization: Secondary | ICD-10-CM | POA: Diagnosis not present

## 2021-05-15 DIAGNOSIS — Z131 Encounter for screening for diabetes mellitus: Secondary | ICD-10-CM | POA: Diagnosis not present

## 2021-05-15 DIAGNOSIS — Z0000 Encounter for general adult medical examination without abnormal findings: Secondary | ICD-10-CM | POA: Diagnosis not present

## 2021-05-15 DIAGNOSIS — F17200 Nicotine dependence, unspecified, uncomplicated: Secondary | ICD-10-CM | POA: Diagnosis not present

## 2021-07-03 DIAGNOSIS — Z1231 Encounter for screening mammogram for malignant neoplasm of breast: Secondary | ICD-10-CM | POA: Diagnosis not present

## 2021-08-18 IMAGING — US US BREAST*L* LIMITED INC AXILLA
1 series · 5 of 5 positions shown · non-contrast
Comparison: None.

CLINICAL DATA: Recall from screening to evaluate a possible left
breast mass.

EXAM:
DIGITAL DIAGNOSTIC left MAMMOGRAM WITH TOMO
ULTRASOUND left BREAST

[Series 1: us breast*left* limited inc axilla · 0.05mm/px · 5 of 5 slices shown]
[im 1/5]
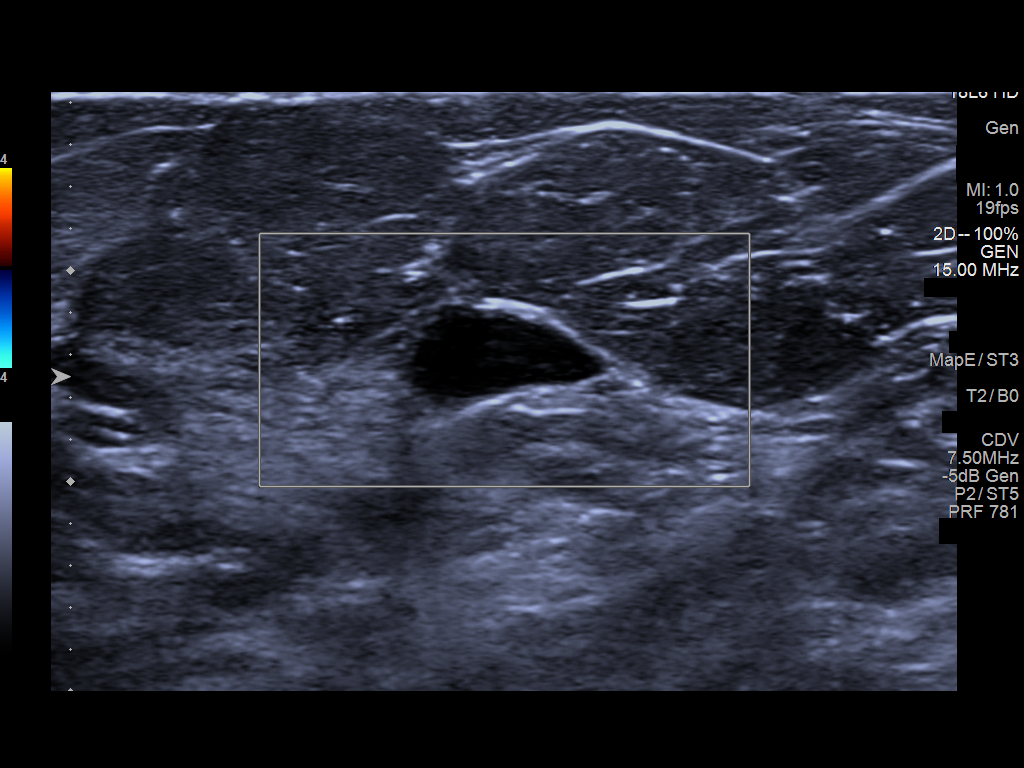
[im 2/5]
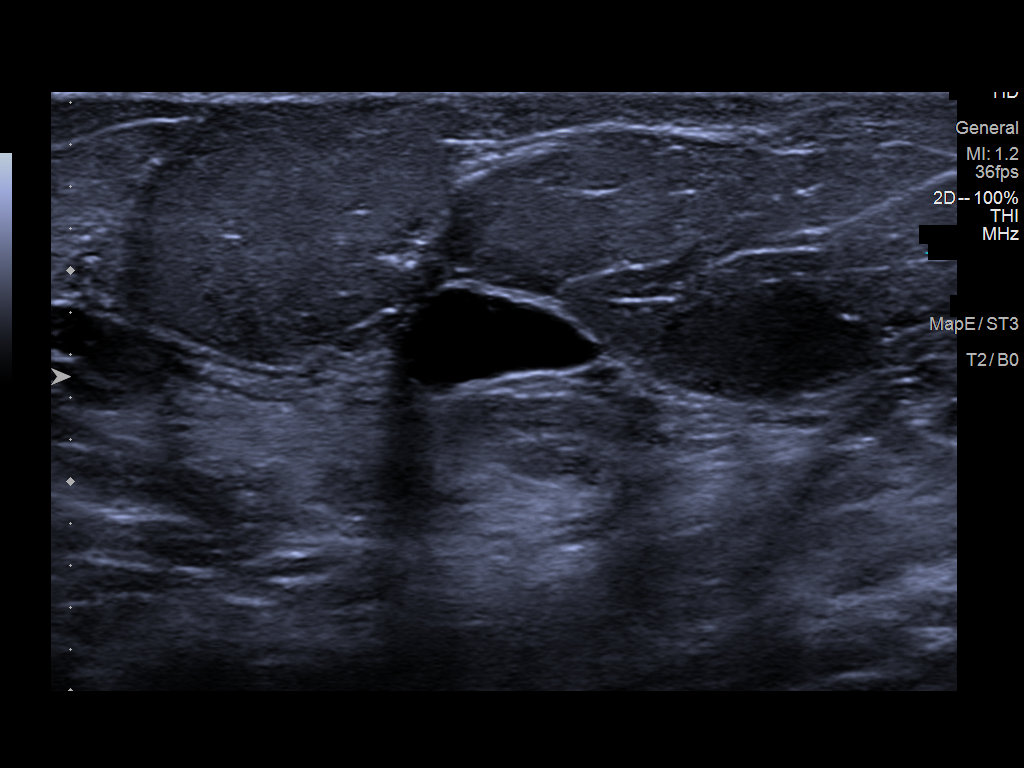
[im 3/5]
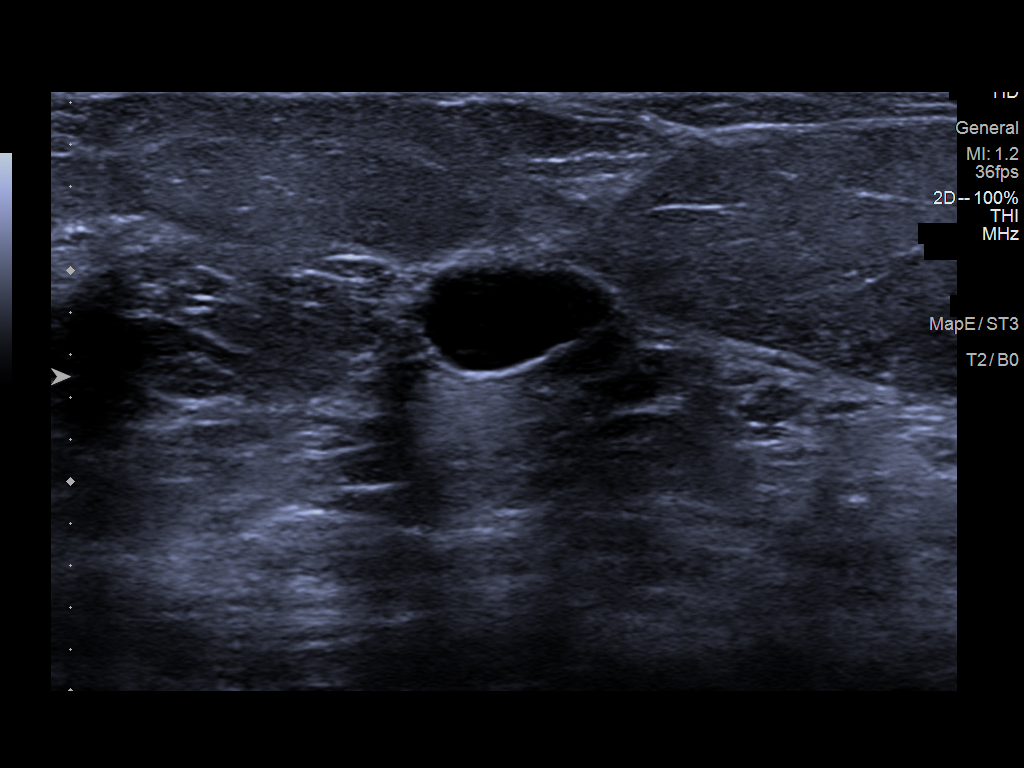
[im 4/5]
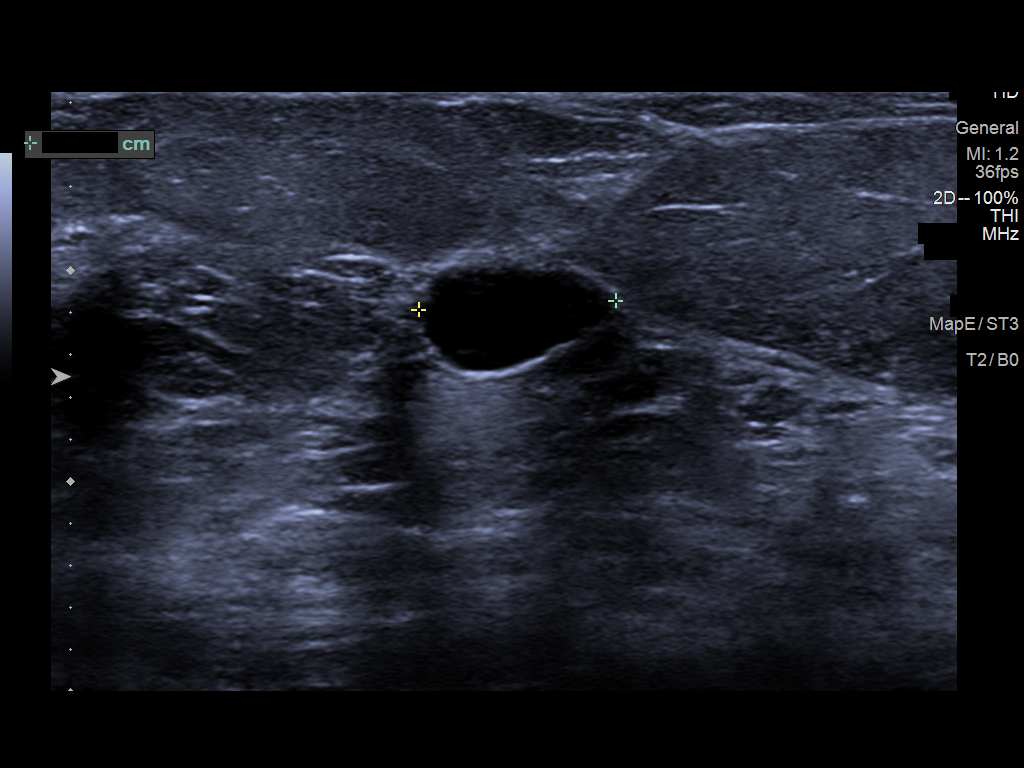
[im 5/5]
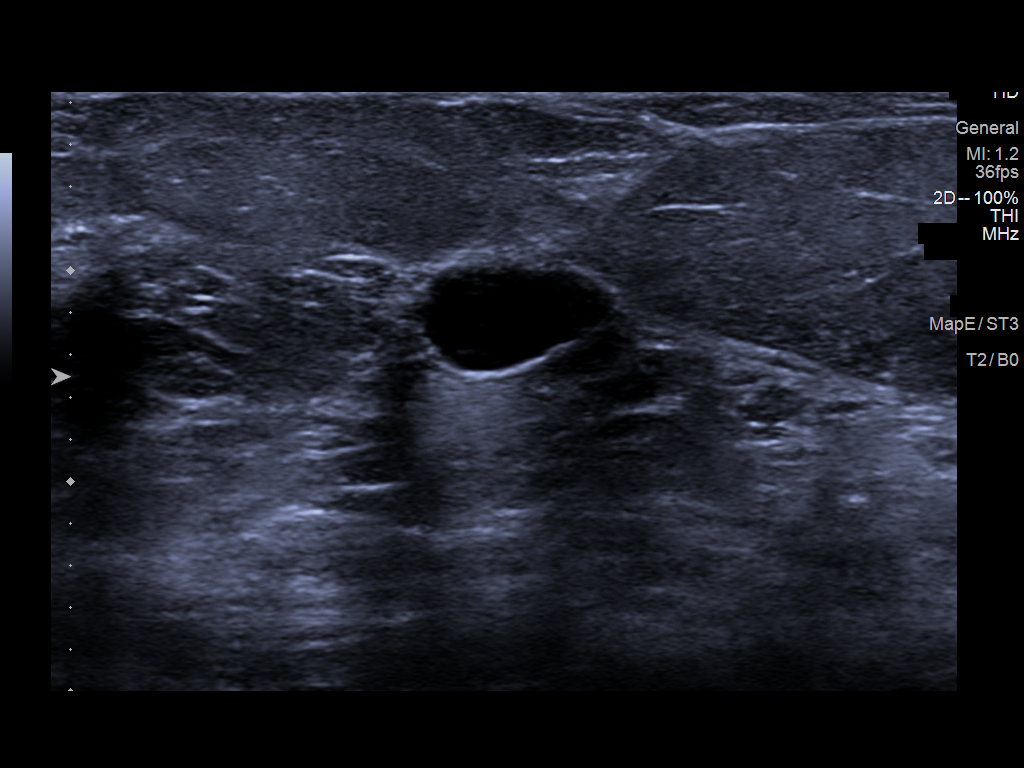

[5 of 5 positions shown; findings below may reference images not displayed]

ACR Breast Density Category b: There are scattered areas of
fibroglandular density.
FINDINGS: The additional spot compression tomographic images demonstrate an
oval circumscribed mass over the lower central left breast in the
middle third.

Targeted ultrasound is performed, showing an oval simple cyst over
the 6 o'clock position of the left breast 5 cm from the nipple
measuring 5 x 9 x 10 mm corresponding to the mammographic finding.
IMPRESSION: 10 mm simple cyst over the 6 o'clock position of the left breast.

RECOMMENDATION:
Recommend continued annual bilateral screening mammographic
follow-up.

I have discussed the findings and recommendations with the patient.
If applicable, a reminder letter will be sent to the patient
regarding the next appointment.

BI-RADS CATEGORY  2: Benign.

## 2021-08-20 ENCOUNTER — Emergency Department: Payer: BC Managed Care – PPO

## 2021-08-20 ENCOUNTER — Other Ambulatory Visit: Payer: Self-pay

## 2021-08-20 ENCOUNTER — Emergency Department
Admission: EM | Admit: 2021-08-20 | Discharge: 2021-08-20 | Disposition: A | Discharge status: Home / Self Care | Payer: BC Managed Care – PPO | Attending: Emergency Medicine | Admitting: Emergency Medicine

## 2021-08-20 ENCOUNTER — Encounter: Payer: Self-pay | Admitting: Emergency Medicine

## 2021-08-20 DIAGNOSIS — M79605 Pain in left leg: Secondary | ICD-10-CM | POA: Diagnosis not present

## 2021-08-20 DIAGNOSIS — M5432 Sciatica, left side: Secondary | ICD-10-CM

## 2021-08-20 DIAGNOSIS — M545 Low back pain, unspecified: Secondary | ICD-10-CM | POA: Diagnosis not present

## 2021-08-20 DIAGNOSIS — M5442 Lumbago with sciatica, left side: Secondary | ICD-10-CM | POA: Diagnosis not present

## 2021-08-20 MED ORDER — CYCLOBENZAPRINE HCL 10 MG PO TABS: 10.0000 mg | ORAL_TABLET | Freq: Three times a day (TID) | ORAL | 0 refills | Status: AC | PRN

## 2021-08-20 MED ORDER — NAPROXEN 500 MG PO TABS: 500.0000 mg | ORAL_TABLET | Freq: Two times a day (BID) | ORAL | 0 refills | Status: AC

## 2021-08-20 NOTE — Discharge Instructions (Addendum)
-  You may take acetaminophen and naproxen as needed for pain.  May additionally take cyclobenzaprine as well, though use caution as it may make you drowsy.  -Please follow-up with the orthopedist listed in these instructions if your symptoms fail to improve after a few weeks.  Reviewed the educational material provided.  -Return to the emergency department anytime if you begin to experience any new or worsening symptoms.

## 2021-08-20 NOTE — ED Triage Notes (Signed)
Patient to ED for lower back pain. Denies problems with bowels or bladder. Pain has "been going on for a while but worse recently." Patient worse on left side.

## 2021-08-20 NOTE — ED Notes (Signed)
Pt A&O, pt given discharge instructions, pt ambulating with steady gait. 

## 2021-08-20 NOTE — ED Provider Notes (Signed)
[facility] Provider Note    Event Date/Time   First MD Initiated Contact with Patient 08/20/21 1727     (approximate)   History   Chief Complaint Back Pain   HPI Gina Cannon is a 56 y.o. female, no significant medical history, presents emergency department for evaluation of low back pain with radiation into the left lower extremity.  She states that has been going on for several months but has recently worsened over the past couple weeks.  She states that she has a dull ache on the left side of her lower back which transitions into sharp intermittent sensations down her buttocks and into her left lower extremity intermittently.  It is exacerbated by certain motions.  Denies fever/chills, weight loss, chest pain, shortness of breath, bowel/bladder dysfunction, nausea/vomiting, abdominal pain, flank pain, dizziness/lightheadedness, dysuria, or numbness/tingling upper or lower extremities.  History Limitations: No limitations.        Physical Exam  Triage Vital Signs: ED Triage Vitals  Enc Vitals Group     BP 08/20/21 1658 126/76     Pulse Rate 08/20/21 1658 78     Resp 08/20/21 1658 18     Temp 08/20/21 1658 98.4 F (36.9 C)     Temp Source 08/20/21 1658 Oral     SpO2 08/20/21 1658 97 %     Weight 08/20/21 1657 240 lb (108.9 kg)     Height 08/20/21 1657 5\' 11"  (1.803 m)     Head Circumference --      Peak Flow --      Pain Score 08/20/21 1657 10     Pain Loc --      Pain Edu? --      Excl. in GC? --     Most recent vital signs: Vitals:   08/20/21 1658 08/20/21 1926  BP: 126/76 138/78  Pulse: 78 67  Resp: 18 16  Temp: 98.4 F (36.9 C) 97.8 F (36.6 C)  SpO2: 97% 98%    General: Awake, NAD.  Skin: Warm, dry. No rashes or lesions.  Eyes: PERRL. Conjunctivae normal.  CV: Good peripheral perfusion.  Resp: Normal effort.  Abd: Soft, non-tender. No distention.  Neuro: At baseline. No gross neurological deficits.   Focused Exam: No  midline lumbar spinal tenderness.  No gross deformity to the left lower extremity.  PMS intact distally.  Positive straight leg test.  She is able to ambulate well on her own without assistance.  Physical Exam    ED Results / Procedures / Treatments  Labs (all labs ordered are listed, but only abnormal results are displayed) Labs Reviewed - No data to display   EKG N/A.   RADIOLOGY  ED Provider Interpretation: I personally reviewed and interpreted this x-ray, no acute abnormalities.  DG Lumbar Spine Complete  Result Date: 08/20/2021 CLINICAL DATA:  Low back pain with left leg pain EXAM: LUMBAR SPINE - COMPLETE 4+ VIEW COMPARISON:  None Available. FINDINGS: 5 lumbar vertebra.  Lowest disc space L5-S1. Mild anterolisthesis L4-5 with disc and facet degeneration. Disc bulging and annular calcification L2-3 and L3-4. L5-S1 intact. Negative for fracture or pars defect. IMPRESSION: Multilevel lumbar degenerative change most prominent L4-5. No acute abnormality. Electronically Signed   By: 10/20/2021 M.D.   On: 08/20/2021 17:43    PROCEDURES:  Critical Care performed: N/A.  Procedures    MEDICATIONS ORDERED IN ED: Medications - No data to display   IMPRESSION / MDM / ASSESSMENT AND PLAN /  ED COURSE  I reviewed the triage vital signs and the nursing notes.                              Differential diagnosis includes, but is not limited to, lumbosacral strain, lumbar radiculopathy, disc herniation, sciatica   Assessment/Plan Presentation consistent with left-sided sciatica.  No evidence of any acute injuries on lumbar x-ray.  She is currently not taking any medications for pain.  Will go ahead and prescribe a course of cyclobenzaprine and naproxen.  Provided her with a referral to orthopedics as well if her symptoms fail to improve after a few weeks.  Will discharge.  Provided the patient with anticipatory guidance, return precautions, and educational material. Encouraged the  patient to return to the emergency department at any time if they begin to experience any new or worsening symptoms. Patient expressed understanding and agreed with the plan.   Patient's presentation is most consistent with acute, uncomplicated illness.       FINAL CLINICAL IMPRESSION(S) / ED DIAGNOSES   Final diagnoses:  Sciatica of left side     Rx / DC Orders   ED Discharge Orders          Ordered    cyclobenzaprine (FLEXERIL) 10 MG tablet  3 times daily PRN        08/20/21 1900    naproxen (NAPROSYN) 500 MG tablet  2 times daily with meals        08/20/21 1900             Note:  This document was prepared using Dragon voice recognition software and may include unintentional dictation errors.   Varney Daily, [place] 08/21/21 7353    Merwyn Katos, MD 08/22/21 [id number]

## 2021-08-20 NOTE — ED Provider Triage Note (Signed)
Emergency Medicine Provider Triage Evaluation Note  Gina Cannon, a 56 y.o. female  was evaluated in triage.  Pt complains of low back pain with left lower extremity referral.  Denies any injury, trauma, falls.  She also denies any bladder or bowel incontinence, foot drop, or saddle anesthesia.  She no symptoms have been chronic in nature but seem worse over the last week or so.  Review of Systems  Positive: LBP, LLE referral Negative: Bladder/bowel incontinence  Physical Exam  LMP  (LMP Unknown)  Gen:   Awake, no distress  NAD Resp:  Normal effort CTA MSK:   Moves extremities without difficulty  Other:    Medical Decision Making  Medically screening exam initiated at 4:55 PM.  Appropriate orders placed.  Gina Cannon was informed that the remainder of the evaluation will be completed by another provider, this initial triage assessment does not replace that evaluation, and the importance of remaining in the ED until their evaluation is complete.  Patient to the ED for evaluation of acute on chronic low back pain.  She reports some left lower extremity referral posteriorly.   [name], PA-C 08/20/21 1657

## 2021-10-10 DIAGNOSIS — Z1389 Encounter for screening for other disorder: Secondary | ICD-10-CM | POA: Diagnosis not present

## 2021-10-10 DIAGNOSIS — M654 Radial styloid tenosynovitis [de Quervain]: Secondary | ICD-10-CM | POA: Diagnosis not present

## 2021-10-10 DIAGNOSIS — G5601 Carpal tunnel syndrome, right upper limb: Secondary | ICD-10-CM | POA: Diagnosis not present

## 2021-10-10 DIAGNOSIS — Z1331 Encounter for screening for depression: Secondary | ICD-10-CM | POA: Diagnosis not present

## 2022-02-20 DIAGNOSIS — J069 Acute upper respiratory infection, unspecified: Secondary | ICD-10-CM | POA: Diagnosis not present

## 2022-03-18 DIAGNOSIS — Z113 Encounter for screening for infections with a predominantly sexual mode of transmission: Secondary | ICD-10-CM | POA: Diagnosis not present

## 2022-03-18 DIAGNOSIS — N898 Other specified noninflammatory disorders of vagina: Secondary | ICD-10-CM | POA: Diagnosis not present

## 2022-03-18 DIAGNOSIS — Z114 Encounter for screening for human immunodeficiency virus [HIV]: Secondary | ICD-10-CM | POA: Diagnosis not present

## 2022-03-18 DIAGNOSIS — A599 Trichomoniasis, unspecified: Secondary | ICD-10-CM | POA: Diagnosis not present

## 2022-04-08 DIAGNOSIS — Z1389 Encounter for screening for other disorder: Secondary | ICD-10-CM | POA: Diagnosis not present

## 2022-04-08 DIAGNOSIS — L0390 Cellulitis, unspecified: Secondary | ICD-10-CM | POA: Diagnosis not present

## 2022-04-08 DIAGNOSIS — Z1331 Encounter for screening for depression: Secondary | ICD-10-CM | POA: Diagnosis not present

## 2022-04-08 DIAGNOSIS — Z131 Encounter for screening for diabetes mellitus: Secondary | ICD-10-CM | POA: Diagnosis not present

## 2022-04-08 DIAGNOSIS — M545 Low back pain, unspecified: Secondary | ICD-10-CM | POA: Diagnosis not present

## 2022-04-08 DIAGNOSIS — M654 Radial styloid tenosynovitis [de Quervain]: Secondary | ICD-10-CM | POA: Diagnosis not present

## 2022-04-15 DIAGNOSIS — M654 Radial styloid tenosynovitis [de Quervain]: Secondary | ICD-10-CM | POA: Diagnosis not present

## 2022-04-16 DIAGNOSIS — F17200 Nicotine dependence, unspecified, uncomplicated: Secondary | ICD-10-CM | POA: Diagnosis not present

## 2022-04-16 DIAGNOSIS — Z1389 Encounter for screening for other disorder: Secondary | ICD-10-CM | POA: Diagnosis not present

## 2022-04-16 DIAGNOSIS — F17213 Nicotine dependence, cigarettes, with withdrawal: Secondary | ICD-10-CM | POA: Diagnosis not present

## 2022-04-16 DIAGNOSIS — R7309 Other abnormal glucose: Secondary | ICD-10-CM | POA: Diagnosis not present

## 2022-04-16 DIAGNOSIS — F17210 Nicotine dependence, cigarettes, uncomplicated: Secondary | ICD-10-CM | POA: Diagnosis not present

## 2022-04-16 DIAGNOSIS — L0390 Cellulitis, unspecified: Secondary | ICD-10-CM | POA: Diagnosis not present

## 2022-04-18 DIAGNOSIS — R262 Difficulty in walking, not elsewhere classified: Secondary | ICD-10-CM | POA: Diagnosis not present

## 2022-04-18 DIAGNOSIS — M545 Low back pain, unspecified: Secondary | ICD-10-CM | POA: Diagnosis not present

## 2022-04-24 DIAGNOSIS — Z1211 Encounter for screening for malignant neoplasm of colon: Secondary | ICD-10-CM | POA: Diagnosis not present

## 2022-05-09 ENCOUNTER — Encounter (INDEPENDENT_AMBULATORY_CARE_PROVIDER_SITE_OTHER): Payer: Self-pay | Admitting: Vascular Surgery

## 2022-05-09 ENCOUNTER — Ambulatory Visit (INDEPENDENT_AMBULATORY_CARE_PROVIDER_SITE_OTHER): Payer: BC Managed Care – PPO | Admitting: Vascular Surgery

## 2022-05-09 VITALS — BP 114/75 | HR 95 | Resp 16 | Wt 247.8 lb

## 2022-05-09 DIAGNOSIS — L97311 Non-pressure chronic ulcer of right ankle limited to breakdown of skin: Secondary | ICD-10-CM

## 2022-05-09 DIAGNOSIS — M7989 Other specified soft tissue disorders: Secondary | ICD-10-CM | POA: Insufficient documentation

## 2022-05-09 NOTE — Assessment & Plan Note (Signed)
3 layer Unna boot was placed on the right lower extremity today.  This should help with the swelling.  This will be changed weekly for 3 to 4 weeks.  Will see her back with a venous reflux study and follow-up to check her swelling and wound at that time.

## 2022-05-09 NOTE — Progress Notes (Signed)
Patient ID: Gina Cannon, female   DOB: Jun 08, 1965, 57 y.o.   MRN: [id number]  Chief Complaint  Patient presents with   New Patient (Initial Visit)    Ref Gina Cannon consult recurrent RLE cellulitis     HPI Gina Cannon is a 57 y.o. female.  I am asked to see the patient by Gina Cannon for evaluation of swelling, discoloration, and skin breakdown on the right leg.  Patient has had chronic swelling on the side but it seems to have gotten worse over the past several months.  She has developed dark discoloration of the right lower leg.  Over the past few weeks, she has an itching and burning area on the medial right ankle.  This is associated with some scabs and early skin breakdown.  No fevers or chills.  There is mild induration and redness of the tissue around the ulcers.  No current left leg symptoms.  She denies a previous history of DVT or superficial thrombophlebitis to her knowledge.  She has been treated with antibiotics for possible cellulitis in this area   Past Medical History:  Diagnosis Date   Neuropathy     Past Surgical History:  Procedure Laterality Date   ABDOMINAL HYSTERECTOMY       Family History  Problem Relation Age of Onset   Diabetes Mother    Hypertension Mother    Diabetes Father    Hypertension Father       Social History   Tobacco Use   Smoking status: Some Days    Packs/day: .5    Types: Cigarettes   Smokeless tobacco: Never  Vaping Use   Vaping Use: Never used  Substance Use Topics   Alcohol use: Yes    Comment: occ   Drug use: No     No Known Allergies  Current Outpatient Medications  Medication Sig Dispense Refill   amoxicillin-clavulanate (AUGMENTIN) 875-125 MG tablet Take 1 tablet by mouth 2 (two) times daily. 14 tablet 0   cephALEXin (KEFLEX) 500 MG capsule Take 1 capsule (500 mg total) by mouth 3 (three) times daily. 30 capsule 0   fluticasone (FLONASE) 50 MCG/ACT nasal spray Place 2 sprays into both nostrils daily. 16 g 0    loratadine (CLARITIN) 10 MG tablet Take 1 tablet (10 mg total) by mouth daily. 30 tablet 0   meloxicam (MOBIC) 15 MG tablet Take 1 tablet (15 mg total) by mouth daily as needed for pain. (Patient not taking: Reported on 05/09/2022) 30 tablet 0   No current facility-administered medications for this visit.      REVIEW OF SYSTEMS (Negative unless checked)  Constitutional: [] Weight loss  [] Fever  [] Chills Cardiac: [] Chest pain   [] Chest pressure   [] Palpitations   [] Shortness of breath when laying flat   [] Shortness of breath at rest   [] Shortness of breath with exertion. Vascular:  [] Pain in legs with walking   [] Pain in legs at rest   [] Pain in legs when laying flat   [] Claudication   [] Pain in feet when walking  [] Pain in feet at rest  [] Pain in feet when laying flat   [] History of DVT   [] Phlebitis   [x] Swelling in legs   [] Varicose veins   [x] Non-healing ulcers Pulmonary:   [] Uses home oxygen   [] Productive cough   [] Hemoptysis   [] Wheeze  [] COPD   [] Asthma Neurologic:  [] Dizziness  [] Blackouts   [] Seizures   [] History of stroke   [] History of TIA  [] Aphasia   []   Temporary blindness   [] Dysphagia   [] Weakness or numbness in arms   [] Weakness or numbness in legs Musculoskeletal:  [] Arthritis   [] Joint swelling   [] Joint pain   [] Low back pain Hematologic:  [] Easy bruising  [] Easy bleeding   [] Hypercoagulable state   [] Anemic  [] Hepatitis Gastrointestinal:  [] Blood in stool   [] Vomiting blood  [] Gastroesophageal reflux/heartburn   [] Abdominal pain Genitourinary:  [] Chronic kidney disease   [] Difficult urination  [] Frequent urination  [] Burning with urination   [] Hematuria Skin:  [] Rashes   [x] Ulcers   [x] Wounds Psychological:  [] History of anxiety   []  History of major depression.    Physical Exam BP 114/75 (BP Location: Left Arm)   Pulse 95   Resp 16   Wt 247 lb 12.8 oz (112.4 kg)   LMP  (LMP Unknown)   BMI 34.56 kg/m  Gen:  WD/WN, NAD Head: [place]/AT, No temporalis wasting.   Ear/Nose/Throat: Hearing grossly intact, nares w/o erythema or drainage, oropharynx w/o Erythema/Exudate Eyes: Conjunctiva clear, sclera non-icteric  Neck: trachea midline.  No JVD.  Pulmonary:  Good air movement, respirations not labored, no use of accessory muscles  Cardiac: RRR, no JVD Vascular:  Vessel Right Left  Radial Palpable Palpable                          DP 2+ 2+  PT NP 1+   Gastrointestinal:. No masses, surgical incisions, or scars. Musculoskeletal: M/S 5/5 throughout.  Extremities without ischemic changes.  No deformity or atrophy.  Superficial skin breakdown with scabbing on the right medial ankle.  Moderate to severe stasis dermatitis changes present on the right lower leg.  2-3+ right lower extremity edema, trace left lower extremity edema. Neurologic: Sensation grossly intact in extremities.  Symmetrical.  Speech is fluent. Motor exam as listed above. Psychiatric: Judgment intact, Mood & affect appropriate for pt's clinical situation. Dermatologic: Right leg as described above    Radiology No results found.  Labs No results found for this or any previous visit (from the past 2160 hour(s)).  Assessment/Plan:  Lower limb ulcer, ankle, right, limited to breakdown of skin (HCC) 3 layer Unna boot was placed on the right lower extremity today.  This should help with the swelling.  This will be changed weekly for 3 to 4 weeks.  Will see her back with a venous reflux study and follow-up to check her swelling and wound at that time.  Swelling of limb The patient has marked swelling of the right lower extremity now associated with ulceration.  This will need to be wrapped with a 3 layer Unna boot today.  This will be changed weekly.  Once we get her wound healed, we can get a venous reflux study and do a full assessment of her swelling and stasis changes to determine next options for treatment.      Gina Cannon 05/09/2022, 2:48 PM   This note was created with  Dragon medical transcription system.  Any errors from dictation are unintentional.

## 2022-05-09 NOTE — Assessment & Plan Note (Signed)
The patient has marked swelling of the right lower extremity now associated with ulceration.  This will need to be wrapped with a 3 layer Unna boot today.  This will be changed weekly.  Once we get her wound healed, we can get a venous reflux study and do a full assessment of her swelling and stasis changes to determine next options for treatment.

## 2022-05-16 ENCOUNTER — Encounter (INDEPENDENT_AMBULATORY_CARE_PROVIDER_SITE_OTHER): Payer: Self-pay | Admitting: Nurse Practitioner

## 2022-05-16 ENCOUNTER — Ambulatory Visit (INDEPENDENT_AMBULATORY_CARE_PROVIDER_SITE_OTHER): Payer: BC Managed Care – PPO | Admitting: Nurse Practitioner

## 2022-05-16 VITALS — BP 111/75 | HR 85 | Resp 16 | Ht 71.0 in | Wt 245.6 lb

## 2022-05-16 DIAGNOSIS — L97311 Non-pressure chronic ulcer of right ankle limited to breakdown of skin: Secondary | ICD-10-CM

## 2022-05-16 NOTE — Progress Notes (Signed)
History of Present Illness  There is no documented history at this time  Assessments & Plan   There are no diagnoses linked to this encounter.    Additional instructions  Subjective:  Patient presents with venous ulcer of the Right lower extremity.    Procedure:  3 layer unna wrap was placed Right lower extremity.   Plan:   Follow up in one week.   

## 2022-05-23 ENCOUNTER — Encounter (INDEPENDENT_AMBULATORY_CARE_PROVIDER_SITE_OTHER): Payer: Self-pay

## 2022-05-23 ENCOUNTER — Ambulatory Visit (INDEPENDENT_AMBULATORY_CARE_PROVIDER_SITE_OTHER): Payer: BC Managed Care – PPO | Admitting: Nurse Practitioner

## 2022-05-23 VITALS — BP 108/74 | HR 81 | Resp 16

## 2022-05-23 DIAGNOSIS — L97311 Non-pressure chronic ulcer of right ankle limited to breakdown of skin: Secondary | ICD-10-CM

## 2022-05-23 NOTE — Progress Notes (Signed)
History of Present Illness  There is no documented history at this time  Assessments & Plan   There are no diagnoses linked to this encounter.    Additional instructions  Subjective:  Patient presents with venous ulcer of the Right lower extremity.    Procedure:  3 layer unna wrap was placed Right lower extremity.   Plan:   Follow up in one week.   

## 2022-05-30 ENCOUNTER — Encounter (INDEPENDENT_AMBULATORY_CARE_PROVIDER_SITE_OTHER): Payer: Self-pay

## 2022-05-30 ENCOUNTER — Ambulatory Visit (INDEPENDENT_AMBULATORY_CARE_PROVIDER_SITE_OTHER): Payer: BC Managed Care – PPO | Admitting: Nurse Practitioner

## 2022-05-30 VITALS — BP 114/72 | HR 83 | Resp 16 | Wt 246.0 lb

## 2022-05-30 DIAGNOSIS — L97311 Non-pressure chronic ulcer of right ankle limited to breakdown of skin: Secondary | ICD-10-CM | POA: Diagnosis not present

## 2022-05-30 NOTE — Progress Notes (Signed)
History of Present Illness  There is no documented history at this time  Assessments & Plan   There are no diagnoses linked to this encounter.    Additional instructions  Subjective:  Patient presents with venous ulcer of the Right lower extremity.    Procedure:  3 layer unna wrap was placed Right lower extremity.   Plan:   Follow up in one week.   

## 2022-06-06 ENCOUNTER — Ambulatory Visit (INDEPENDENT_AMBULATORY_CARE_PROVIDER_SITE_OTHER): Payer: BC Managed Care – PPO | Admitting: Nurse Practitioner

## 2022-06-06 ENCOUNTER — Encounter (INDEPENDENT_AMBULATORY_CARE_PROVIDER_SITE_OTHER): Payer: Self-pay | Admitting: Nurse Practitioner

## 2022-06-06 VITALS — BP 109/75 | HR 85 | Resp 18

## 2022-06-06 DIAGNOSIS — L97311 Non-pressure chronic ulcer of right ankle limited to breakdown of skin: Secondary | ICD-10-CM

## 2022-06-06 NOTE — Progress Notes (Signed)
History of Present Illness  There is no documented history at this time  Assessments & Plan   There are no diagnoses linked to this encounter.    Additional instructions  Subjective:  Patient presents with venous ulcer of the Right lower extremity.    Procedure:  3 layer unna wrap was placed Right lower extremity.   Plan:   Follow up in one week.   [place] wrap

## 2022-06-13 ENCOUNTER — Encounter (INDEPENDENT_AMBULATORY_CARE_PROVIDER_SITE_OTHER): Payer: BC Managed Care – PPO

## 2022-06-13 ENCOUNTER — Ambulatory Visit (INDEPENDENT_AMBULATORY_CARE_PROVIDER_SITE_OTHER): Payer: BC Managed Care – PPO | Admitting: Vascular Surgery

## 2022-06-17 ENCOUNTER — Ambulatory Visit (INDEPENDENT_AMBULATORY_CARE_PROVIDER_SITE_OTHER): Payer: BC Managed Care – PPO | Admitting: Vascular Surgery

## 2022-06-17 ENCOUNTER — Encounter (INDEPENDENT_AMBULATORY_CARE_PROVIDER_SITE_OTHER): Payer: Self-pay | Admitting: Vascular Surgery

## 2022-06-17 VITALS — BP 123/78 | HR 77 | Resp 18 | Ht 71.0 in | Wt 249.6 lb

## 2022-06-17 DIAGNOSIS — L97311 Non-pressure chronic ulcer of right ankle limited to breakdown of skin: Secondary | ICD-10-CM | POA: Diagnosis not present

## 2022-06-17 DIAGNOSIS — M7989 Other specified soft tissue disorders: Secondary | ICD-10-CM | POA: Diagnosis not present

## 2022-06-18 NOTE — Progress Notes (Signed)
MRN : [id number]  Gina Cannon is a 57 y.o. (1966-01-03) female who presents with chief complaint of  Chief Complaint  Patient presents with   Follow-up    unnaboot f/u  .  History of Present Illness: Patient returns today in follow up of Her leg swelling and ulceration.  Her ulcers have healed and her swelling is significantly better after several weeks and Unna boots.  She does still have a lot of dry scaly skin all areas but no further weeping or drainage.  Fevers or chills.  No chest pain or shortness of breath  Current Outpatient Medications  Medication Sig Dispense Refill   amoxicillin-clavulanate (AUGMENTIN) 875-125 MG tablet Take 1 tablet by mouth 2 (two) times daily. 14 tablet 0   cephALEXin (KEFLEX) 500 MG capsule Take 1 capsule (500 mg total) by mouth 3 (three) times daily. 30 capsule 0   fluticasone (FLONASE) 50 MCG/ACT nasal spray Place 2 sprays into both nostrils daily. 16 g 0   loratadine (CLARITIN) 10 MG tablet Take 1 tablet (10 mg total) by mouth daily. 30 tablet 0   meloxicam (MOBIC) 15 MG tablet Take 1 tablet (15 mg total) by mouth daily as needed for pain. 30 tablet 0   No current facility-administered medications for this visit.    Past Medical History:  Diagnosis Date   Neuropathy     Past Surgical History:  Procedure Laterality Date   ABDOMINAL HYSTERECTOMY       Social History   Tobacco Use   Smoking status: Some Days    Packs/day: .5    Types: Cigarettes   Smokeless tobacco: Never  Vaping Use   Vaping Use: Never used  Substance Use Topics   Alcohol use: Yes    Comment: occ   Drug use: No      Family History  Problem Relation Age of Onset   Diabetes Mother    Hypertension Mother    Diabetes Father    Hypertension Father      No Known Allergies   REVIEW OF SYSTEMS (Negative unless checked)  Constitutional: [] Weight loss  [] Fever  [] Chills Cardiac: [] Chest pain   [] Chest pressure   [] Palpitations   [] Shortness of breath  when laying flat   [] Shortness of breath at rest   [] Shortness of breath with exertion. Vascular:  [] Pain in legs with walking   [] Pain in legs at rest   [] Pain in legs when laying flat   [] Claudication   [] Pain in feet when walking  [] Pain in feet at rest  [] Pain in feet when laying flat   [] History of DVT   [] Phlebitis   [x] Swelling in legs   [] Varicose veins   [x] Non-healing ulcers Pulmonary:   [] Uses home oxygen   [] Productive cough   [] Hemoptysis   [] Wheeze  [] COPD   [] Asthma Neurologic:  [] Dizziness  [] Blackouts   [] Seizures   [] History of stroke   [] History of TIA  [] Aphasia   [] Temporary blindness   [] Dysphagia   [] Weakness or numbness in arms   [] Weakness or numbness in legs Musculoskeletal:  [] Arthritis   [] Joint swelling   [] Joint pain   [] Low back pain Hematologic:  [] Easy bruising  [] Easy bleeding   [] Hypercoagulable state   [] Anemic   Gastrointestinal:  [] Blood in stool   [] Vomiting blood  [] Gastroesophageal reflux/heartburn   [] Abdominal pain Genitourinary:  [] Chronic kidney disease   [] Difficult urination  [] Frequent urination  [] Burning with urination   [] Hematuria Skin:  [] Rashes   [x] Ulcers   [  x]Wounds Psychological:  [] History of anxiety   []  History of major depression.  Physical Examination  BP 123/78 (BP Location: Left Arm)   Pulse 77   Resp 18   Ht 5\' 11"  (1.803 m)   Wt 249 lb 9.6 oz (113.2 kg)   LMP  (LMP Unknown)   BMI 34.81 kg/m  Gen:  WD/WN, NAD Head: [place]/AT, No temporalis wasting. Ear/Nose/Throat: Hearing grossly intact, nares w/o erythema or drainage Eyes: Conjunctiva clear. Sclera non-icteric Neck: Supple.  Trachea midline Pulmonary:  Good air movement, no use of accessory muscles.  Cardiac: RRR, no JVD Vascular:  Vessel Right Left  Radial Palpable Palpable           Musculoskeletal: M/S 5/5 throughout.  No deformity or atrophy.  1+ right lower extremity edema, trace left lower extremity edema. Neurologic: Sensation grossly intact in extremities.   Symmetrical.  Speech is fluent.  Psychiatric: Judgment intact, Mood & affect appropriate for pt's clinical situation. Dermatologic: Previous ulcerations have largely healed.  Dry scaly skin overlying.      Labs No results found for this or any previous visit (from the past 2160 hour(s)).  Radiology No results found.  Assessment/Plan  Lower limb ulcer, ankle, right, limited to breakdown of skin (HCC) The wound has healed but the area is still quite raw.  We are going to go one more week in [place] boots to get the skin completely healed and then go to compression socks alone.    Swelling of limb Improved with UNNA boots.  One more week in UNNA boots to get the skin back to baseline and then can do compression socks daily.     Festus Barren, MD  06/19/2022 10:08 AM    This note was created with Dragon medical transcription system.  Any errors from dictation are purely unintentional

## 2022-06-19 NOTE — Assessment & Plan Note (Signed)
Improved with UNNA boots.  One more week in UNNA boots to get the skin back to baseline and then can do compression socks daily.

## 2022-06-19 NOTE — Assessment & Plan Note (Signed)
The wound has healed but the area is still quite raw.  We are going to go one more week in [place] boots to get the skin completely healed and then go to compression socks alone.

## 2022-06-27 ENCOUNTER — Encounter (INDEPENDENT_AMBULATORY_CARE_PROVIDER_SITE_OTHER): Payer: BC Managed Care – PPO

## 2022-07-22 ENCOUNTER — Encounter (INDEPENDENT_AMBULATORY_CARE_PROVIDER_SITE_OTHER): Payer: Self-pay | Admitting: Nurse Practitioner

## 2022-07-22 ENCOUNTER — Ambulatory Visit (INDEPENDENT_AMBULATORY_CARE_PROVIDER_SITE_OTHER): Payer: BLUE CROSS/BLUE SHIELD | Admitting: Nurse Practitioner

## 2022-07-22 VITALS — BP 125/82 | HR 75 | Resp 18 | Ht 71.0 in | Wt 249.8 lb

## 2022-07-22 DIAGNOSIS — L97311 Non-pressure chronic ulcer of right ankle limited to breakdown of skin: Secondary | ICD-10-CM

## 2022-07-23 NOTE — Progress Notes (Signed)
Subjective:    Patient ID: Gina Cannon, female    DOB: 03-27-1965, 57 y.o.   MRN: [id number] Chief Complaint  Patient presents with   Follow-up    Follow up- unna boot      Patient returns today in follow up of Her leg swelling and ulceration.  Her ulcers have healed and her swelling is significantly better after several weeks and Unna boots.  She does still have a lot of dry scaly skin all areas but no further weeping or drainage.  Fevers or chills.  No chest pain or shortness of breath    Review of Systems  Cardiovascular:  Negative for leg swelling.  Skin:  Negative for wound.  All other systems reviewed and are negative.      Objective:   Physical Exam Vitals reviewed.  HENT:     Head: Normocephalic.  Cardiovascular:     Rate and Rhythm: Normal rate.     Pulses:          Popliteal pulses are 2+ on the right side.  Pulmonary:     Effort: Pulmonary effort is normal.  Musculoskeletal:     Right lower leg: No edema.  Skin:    General: Skin is warm and dry.  Neurological:     Mental Status: She is alert and oriented to person, place, and time.  Psychiatric:        Mood and Affect: Mood normal.        Behavior: Behavior normal.        Thought Content: Thought content normal.        Judgment: Judgment normal.     BP 125/82 (BP Location: Right Arm)   Pulse 75   Resp 18   Ht 5\' 11"  (1.803 m)   Wt 249 lb 12.8 oz (113.3 kg)   LMP  (LMP Unknown)   BMI 34.84 kg/m   Past Medical History:  Diagnosis Date   Neuropathy     Social History   Socioeconomic History   Marital status: Single    Spouse name: Not on file   Number of children: Not on file   Years of education: Not on file   Highest education level: Not on file  Occupational History   Not on file  Tobacco Use   Smoking status: Some Days    Packs/day: .5    Types: Cigarettes   Smokeless tobacco: Never  Vaping Use   Vaping Use: Never used  Substance and Sexual Activity   Alcohol use: Yes     Comment: occ   Drug use: No   Sexual activity: Not on file  Other Topics Concern   Not on file  Social History Narrative   Not on file   Social Determinants of Health   Financial Resource Strain: Not on file  Food Insecurity: Not on file  Transportation Needs: Not on file  Physical Activity: Not on file  Stress: Not on file  Social Connections: Not on file  Intimate Partner Violence: Not on file    Past Surgical History:  Procedure Laterality Date   ABDOMINAL HYSTERECTOMY      Family History  Problem Relation Age of Onset   Diabetes Mother    Hypertension Mother    Diabetes Father    Hypertension Father     No Known Allergies     Latest Ref Rng & Units 07/31/2020    4:57 PM 09/27/2013    9:33 AM 09/18/2013    6:43 PM  CBC  WBC 4.0 - 10.5 K/uL 9.1  8.1  10.4   Hemoglobin 12.0 - 15.0 g/dL 16.1  09.6  04.5   Hematocrit 36.0 - 46.0 % 41.4  42.0  41.2   Platelets 150 - 400 K/uL 190  205  218       CMP     Component Value Date/Time   NA 139 07/31/2020 1657   NA 136 03/06/2013 1448   K 4.2 07/31/2020 1657   K 3.7 03/06/2013 1448   CL 103 07/31/2020 1657   CL 105 03/06/2013 1448   CO2 32 07/31/2020 1657   CO2 27 03/06/2013 1448   GLUCOSE 105 (H) 07/31/2020 1657   GLUCOSE 123 (H) 03/06/2013 1448   BUN 12 07/31/2020 1657   BUN 13 03/06/2013 1448   CREATININE 0.65 07/31/2020 1657   CREATININE 0.68 03/06/2013 1448   CALCIUM 9.2 07/31/2020 1657   CALCIUM 8.7 03/06/2013 1448   PROT 7.4 07/31/2020 1657   PROT 7.9 10/05/2012 1640   ALBUMIN 3.8 07/31/2020 1657   ALBUMIN 3.7 10/05/2012 1640   AST 27 07/31/2020 1657   AST 23 10/05/2012 1640   ALT 26 07/31/2020 1657   ALT 22 10/05/2012 1640   ALKPHOS 66 07/31/2020 1657   ALKPHOS 66 10/05/2012 1640   BILITOT 0.5 07/31/2020 1657   BILITOT 0.2 10/05/2012 1640   GFRNONAA >60 07/31/2020 1657   GFRNONAA >60 03/06/2013 1448     No results found.     Assessment & Plan:   1. Lower limb ulcer, ankle, right,  limited to breakdown of skin (HCC) The patient swelling is much improved but she has had multiple episodes of venous ulcer to the to the right lower extremity.  This is concerning that she may have some underlying venous stasis disease.  Will have her return at her convenience for right lower extremity venous reflux study with the possibility of possible intervention.  In the interim the patient should continue with use of compression, elevation and activity to help with control of her lower extremity edema.   No current outpatient medications on file prior to visit.   No current facility-administered medications on file prior to visit.    There are no Patient Instructions on file for this visit. No follow-ups on file.   Georgiana Spinner, NP

## 2022-07-24 DIAGNOSIS — Z1231 Encounter for screening mammogram for malignant neoplasm of breast: Secondary | ICD-10-CM | POA: Diagnosis not present

## 2022-08-24 DIAGNOSIS — R8290 Unspecified abnormal findings in urine: Secondary | ICD-10-CM | POA: Diagnosis not present

## 2022-08-24 DIAGNOSIS — M545 Low back pain, unspecified: Secondary | ICD-10-CM | POA: Diagnosis not present

## 2022-08-27 ENCOUNTER — Other Ambulatory Visit (INDEPENDENT_AMBULATORY_CARE_PROVIDER_SITE_OTHER): Payer: Self-pay | Admitting: Nurse Practitioner

## 2022-08-27 DIAGNOSIS — L97311 Non-pressure chronic ulcer of right ankle limited to breakdown of skin: Secondary | ICD-10-CM

## 2022-08-29 ENCOUNTER — Ambulatory Visit (INDEPENDENT_AMBULATORY_CARE_PROVIDER_SITE_OTHER): Payer: BLUE CROSS/BLUE SHIELD

## 2022-08-29 ENCOUNTER — Ambulatory Visit (INDEPENDENT_AMBULATORY_CARE_PROVIDER_SITE_OTHER): Payer: BLUE CROSS/BLUE SHIELD | Admitting: Nurse Practitioner

## 2022-08-29 ENCOUNTER — Encounter (INDEPENDENT_AMBULATORY_CARE_PROVIDER_SITE_OTHER): Payer: Self-pay | Admitting: Nurse Practitioner

## 2022-08-29 VITALS — BP 110/72 | HR 90 | Resp 16 | Wt 251.0 lb

## 2022-08-29 DIAGNOSIS — L97311 Non-pressure chronic ulcer of right ankle limited to breakdown of skin: Secondary | ICD-10-CM

## 2022-09-01 ENCOUNTER — Encounter (INDEPENDENT_AMBULATORY_CARE_PROVIDER_SITE_OTHER): Payer: Self-pay | Admitting: Nurse Practitioner

## 2022-09-01 NOTE — Progress Notes (Signed)
Subjective:    Patient ID: Gina Cannon, female    DOB: 1965/06/17, 57 y.o.   MRN: [id number] Chief Complaint  Patient presents with   Follow-up    Ultrasound follow up    The patient returns today for evaluation of her right leg swelling and ulceration.  The swelling ulceration continue to be improved with conservative therapy.  She was in wraps for several weeks.  Has been no recurrence of wounds.  She returns for evaluation for possible venous insufficiency as a contributing cause it to the ulceration.  Today noninvasive study showed no evidence of DVT or superficial venous reflux in the right lower extremity.  No evidence of deep venous insufficiency.    Review of Systems  Cardiovascular:  Negative for leg swelling.  Skin:  Negative for wound.  All other systems reviewed and are negative.      Objective:   Physical Exam Vitals reviewed.  HENT:     Head: Normocephalic.  Cardiovascular:     Rate and Rhythm: Normal rate.     Pulses:          Popliteal pulses are 2+ on the right side.  Pulmonary:     Effort: Pulmonary effort is normal.  Musculoskeletal:     Right lower leg: No edema.  Skin:    General: Skin is warm and dry.  Neurological:     Mental Status: She is alert and oriented to person, place, and time.  Psychiatric:        Mood and Affect: Mood normal.        Behavior: Behavior normal.        Thought Content: Thought content normal.        Judgment: Judgment normal.     BP 110/72 (BP Location: Left Arm)   Pulse 90   Resp 16   Wt 251 lb (113.9 kg)   LMP  (LMP Unknown)   BMI 35.01 kg/m   Past Medical History:  Diagnosis Date   Neuropathy     Social History   Socioeconomic History   Marital status: Single    Spouse name: Not on file   Number of children: Not on file   Years of education: Not on file   Highest education level: Not on file  Occupational History   Not on file  Tobacco Use   Smoking status: Some Days    Current packs/day:  0.50    Types: Cigarettes   Smokeless tobacco: Never  Vaping Use   Vaping status: Never Used  Substance and Sexual Activity   Alcohol use: Yes    Comment: occ   Drug use: No   Sexual activity: Not on file  Other Topics Concern   Not on file  Social History Narrative   Not on file   Social Determinants of Health   Financial Resource Strain: Not on file  Food Insecurity: Not on file  Transportation Needs: Not on file  Physical Activity: Not on file  Stress: Not on file  Social Connections: Not on file  Intimate Partner Violence: Not on file    Past Surgical History:  Procedure Laterality Date   ABDOMINAL HYSTERECTOMY      Family History  Problem Relation Age of Onset   Diabetes Mother    Hypertension Mother    Diabetes Father    Hypertension Father     No Known Allergies     Latest Ref Rng & Units 07/31/2020    4:57 PM 09/27/2013  9:33 AM 09/18/2013    6:43 PM  CBC  WBC 4.0 - 10.5 K/uL 9.1  8.1  10.4   Hemoglobin 12.0 - 15.0 g/dL 40.9  81.1  91.4   Hematocrit 36.0 - 46.0 % 41.4  42.0  41.2   Platelets 150 - 400 K/uL 190  205  218       CMP     Component Value Date/Time   NA 139 07/31/2020 1657   NA 136 03/06/2013 1448   K 4.2 07/31/2020 1657   K 3.7 03/06/2013 1448   CL 103 07/31/2020 1657   CL 105 03/06/2013 1448   CO2 32 07/31/2020 1657   CO2 27 03/06/2013 1448   GLUCOSE 105 (H) 07/31/2020 1657   GLUCOSE 123 (H) 03/06/2013 1448   BUN 12 07/31/2020 1657   BUN 13 03/06/2013 1448   CREATININE 0.65 07/31/2020 1657   CREATININE 0.68 03/06/2013 1448   CALCIUM 9.2 07/31/2020 1657   CALCIUM 8.7 03/06/2013 1448   PROT 7.4 07/31/2020 1657   PROT 7.9 10/05/2012 1640   ALBUMIN 3.8 07/31/2020 1657   ALBUMIN 3.7 10/05/2012 1640   AST 27 07/31/2020 1657   AST 23 10/05/2012 1640   ALT 26 07/31/2020 1657   ALT 22 10/05/2012 1640   ALKPHOS 66 07/31/2020 1657   ALKPHOS 66 10/05/2012 1640   BILITOT 0.5 07/31/2020 1657   BILITOT 0.2 10/05/2012 1640    GFRNONAA >60 07/31/2020 1657   GFRNONAA >60 03/06/2013 1448     No results found.     Assessment & Plan:   1. Lower limb ulcer, ankle, right, limited to breakdown of skin (HCC) Today noninvasive study showed no evidence of superficial venous reflux.  Baseline is no role for intervention.  Patient advised to continue with conservative therapy including use of medical grade compression, elevation and activity.  Will plan on having the patient return in 1 year or sooner if issues arise.  Current Outpatient Medications on File Prior to Visit  Medication Sig Dispense Refill   naproxen (NAPROSYN) 500 MG tablet Take 500 mg by mouth 2 (two) times daily with a meal.     No current facility-administered medications on file prior to visit.    There are no Patient Instructions on file for this visit. No follow-ups on file.   Georgiana Spinner, NP

## 2023-01-17 ENCOUNTER — Other Ambulatory Visit: Payer: Self-pay

## 2023-01-17 ENCOUNTER — Emergency Department: Payer: Self-pay

## 2023-01-17 ENCOUNTER — Emergency Department
Admission: EM | Admit: 2023-01-17 | Discharge: 2023-01-17 | Disposition: A | Discharge status: Home / Self Care | Payer: Self-pay | Attending: Emergency Medicine | Admitting: Emergency Medicine

## 2023-01-17 DIAGNOSIS — Z20822 Contact with and (suspected) exposure to covid-19: Secondary | ICD-10-CM | POA: Insufficient documentation

## 2023-01-17 DIAGNOSIS — J069 Acute upper respiratory infection, unspecified: Secondary | ICD-10-CM | POA: Insufficient documentation

## 2023-01-17 LAB — RESP PANEL BY RT-PCR (RSV, FLU A&B, COVID)  RVPGX2
Influenza A by PCR: NEGATIVE
Influenza B by PCR: NEGATIVE
Resp Syncytial Virus by PCR: NEGATIVE
SARS Coronavirus 2 by RT PCR: NEGATIVE

## 2023-01-17 MED ORDER — BENZONATATE 100 MG PO CAPS: 100.0000 mg | ORAL_CAPSULE | Freq: Three times a day (TID) | ORAL | 0 refills | Status: DC | PRN

## 2023-01-17 NOTE — Discharge Instructions (Signed)
 Please continue to take over-the-counter cold medications.  I have also sent a cough medication to the pharmacy.  This can be taken 3 times daily as needed for cough.

## 2023-01-17 NOTE — ED Provider Notes (Signed)
 [facility] Provider Note    Event Date/Time   First MD Initiated Contact with Patient 01/17/23 1352     (approximate)   History   Cough   HPI  Gina Cannon is a 58 y.o. female  with no PMH who presents for evaluation of a cough and congestion.  Patient denies fevers at home, she has been managing her symptoms using over-the-counter cold medications.      Physical Exam   Triage Vital Signs: ED Triage Vitals  Encounter Vitals Group     BP 01/17/23 1218 125/74     Systolic BP Percentile --      Diastolic BP Percentile --      Pulse Rate 01/17/23 1218 92     Resp 01/17/23 1218 20     Temp 01/17/23 1218 98.6 F (37 C)     Temp Source 01/17/23 1218 Oral     SpO2 01/17/23 1218 94 %     Weight --      Height --      Head Circumference --      Peak Flow --      Pain Score 01/17/23 1214 0     Pain Loc --      Pain Education --      Exclude from Growth Chart --     Most recent vital signs: Vitals:   01/17/23 1218  BP: 125/74  Pulse: 92  Resp: 20  Temp: 98.6 F (37 C)  SpO2: 94%    General: Awake, no distress.  CV:  Good peripheral perfusion.  RRR. Resp:  Normal effort.  CTAB. Abd:  No distention.  Other:  Mucous membranes are moist, no pharyngeal erythema, no tonsillar enlargement or exudates, no tender lymphadenopathy.   ED Results / Procedures / Treatments   Labs (all labs ordered are listed, but only abnormal results are displayed) Labs Reviewed  RESP PANEL BY RT-PCR (RSV, FLU A&B, COVID)  [id number]     RADIOLOGY  Chest x-ray obtained, interpreted the images as well as reviewed the radiologist report which is negative for any acute cardiopulmonary abnormalities.   PROCEDURES:  Critical Care performed: No  Procedures   MEDICATIONS ORDERED IN ED: Medications - No data to display   IMPRESSION / MDM / ASSESSMENT AND PLAN / ED COURSE  I reviewed the triage vital signs and the nursing notes.                              58 year old female presents for evaluation of cough and congestion for a week.  Vital signs are stable patient NAD on exam.  Differential diagnosis includes, but is not limited to, flu, COVID, RSV, bronchitis, pneumonia.  Patient's presentation is most consistent with acute complicated illness / injury requiring diagnostic workup.  Respiratory panel was negative for flu, COVID and RSV.  Chest x-ray was also negative.  I believe patient has viral URI.  We discussed symptomatic management using over-the-counter cold medications.  Will also send prescription for cough medication.  She was given a note for work.  She voiced understanding, all questions were answered and she was stable at discharge.    FINAL CLINICAL IMPRESSION(S) / ED DIAGNOSES   Final diagnoses:  Viral URI with cough     Rx / DC Orders   ED Discharge Orders          Ordered    benzonatate  (TESSALON  PERLES) 100 MG  capsule  3 times daily PRN        01/17/23 1417             Note:  This document was prepared using Dragon voice recognition software and may include unintentional dictation errors.   Cleaster Tinnie LABOR, PA-C 01/17/23 1418    Jossie Artist POUR, MD 01/17/23 1425

## 2023-01-17 NOTE — ED Triage Notes (Signed)
 Patient states cough and congestion

## 2023-04-27 IMAGING — CR DG CHEST 2V
2 series · 2 of 2 positions shown · non-contrast
Comparison: Chest x-ray 02/10/2016, CT chest 11/01/2013

CLINICAL DATA: right leg swelling, chest pain

EXAM:
CHEST - 2 VIEW

[chest pa]
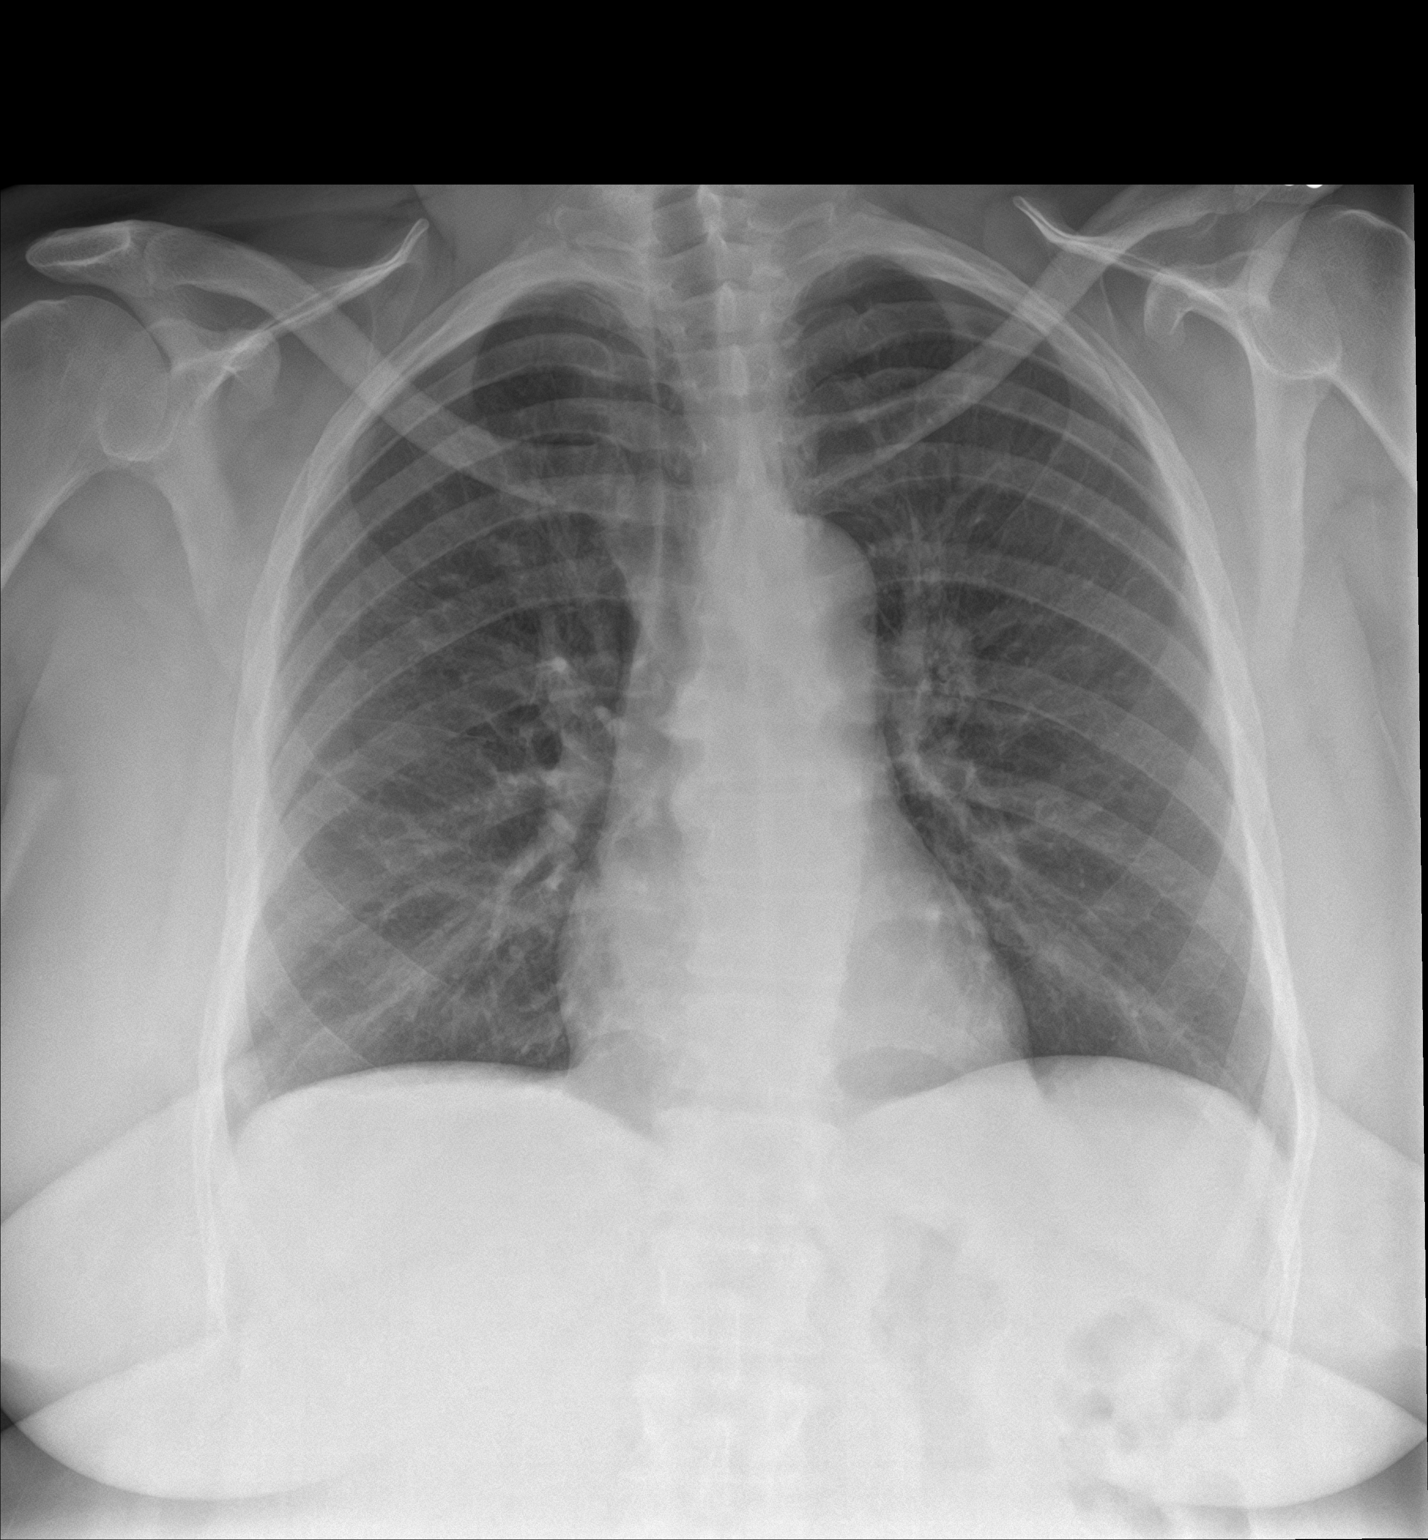

[chest lat]
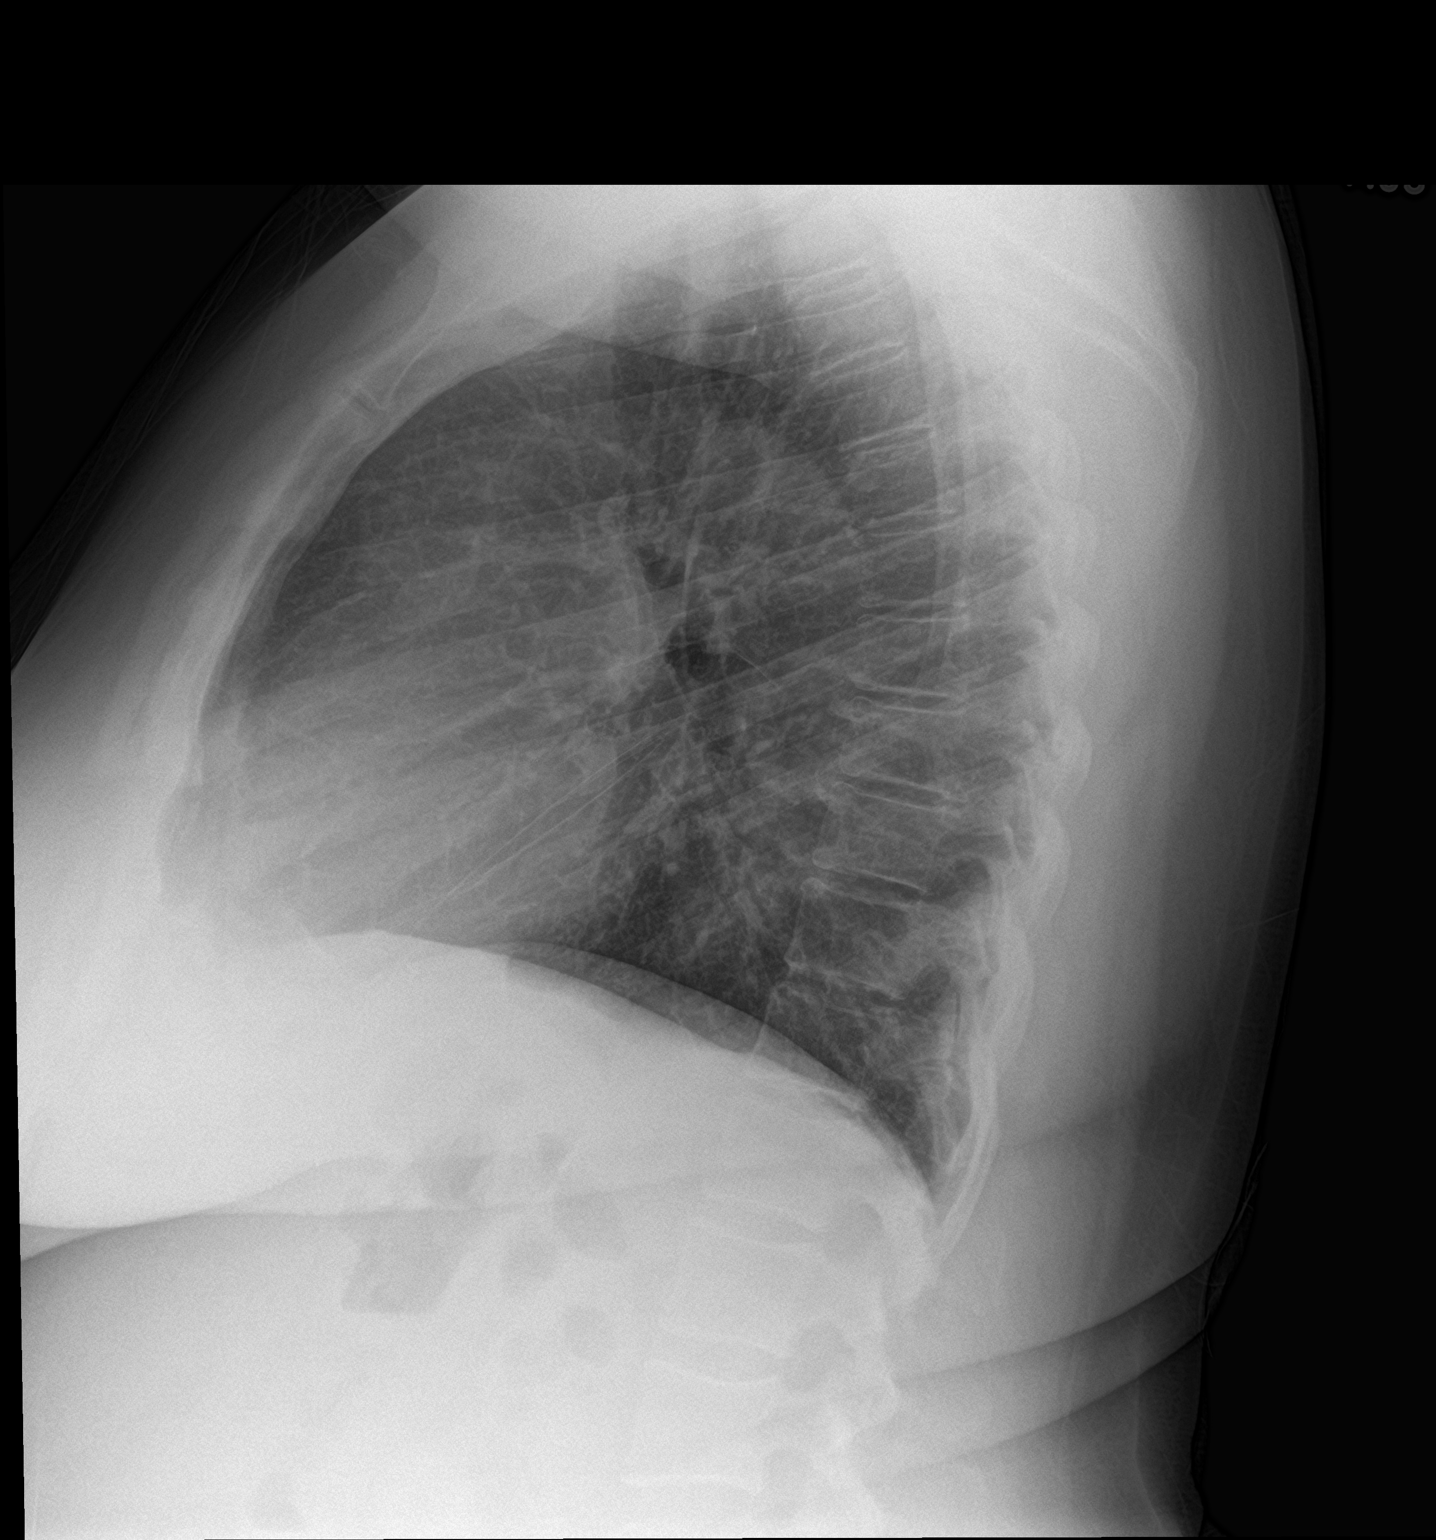

[2 of 2 positions shown; findings below may reference images not displayed]

FINDINGS: The heart size and mediastinal contours are within normal limits.

No focal consolidation. No pulmonary edema. No pleural effusion. No
pneumothorax.

No acute osseous abnormality.
IMPRESSION: No active cardiopulmonary disease.

## 2023-06-02 ENCOUNTER — Encounter (INDEPENDENT_AMBULATORY_CARE_PROVIDER_SITE_OTHER): Payer: Self-pay

## 2023-07-24 ENCOUNTER — Other Ambulatory Visit: Payer: Self-pay | Admitting: Family Medicine

## 2023-07-24 DIAGNOSIS — Z1231 Encounter for screening mammogram for malignant neoplasm of breast: Secondary | ICD-10-CM

## 2023-08-25 ENCOUNTER — Other Ambulatory Visit: Payer: Self-pay | Admitting: *Deleted

## 2023-08-25 ENCOUNTER — Inpatient Hospital Stay
Admission: RE | Admit: 2023-08-25 | Discharge: 2023-08-25 | Disposition: A | Discharge status: Home / Self Care | Payer: Self-pay | Source: Ambulatory Visit | Attending: Family Medicine | Admitting: Family Medicine

## 2023-08-25 DIAGNOSIS — Z1231 Encounter for screening mammogram for malignant neoplasm of breast: Secondary | ICD-10-CM

## 2023-09-04 ENCOUNTER — Encounter (INDEPENDENT_AMBULATORY_CARE_PROVIDER_SITE_OTHER): Payer: Self-pay | Admitting: Nurse Practitioner

## 2023-09-04 ENCOUNTER — Ambulatory Visit
Admission: RE | Admit: 2023-09-04 | Discharge: 2023-09-04 | Disposition: A | Discharge status: Home / Self Care | Payer: Self-pay | Source: Ambulatory Visit | Attending: Family Medicine | Admitting: Family Medicine

## 2023-09-04 ENCOUNTER — Ambulatory Visit (INDEPENDENT_AMBULATORY_CARE_PROVIDER_SITE_OTHER): Payer: BLUE CROSS/BLUE SHIELD | Admitting: Nurse Practitioner

## 2023-09-04 VITALS — BP 111/74 | HR 89 | Ht 71.0 in | Wt 240.0 lb

## 2023-09-04 DIAGNOSIS — Z1231 Encounter for screening mammogram for malignant neoplasm of breast: Secondary | ICD-10-CM | POA: Diagnosis present

## 2023-09-04 DIAGNOSIS — M7989 Other specified soft tissue disorders: Secondary | ICD-10-CM

## 2023-09-06 ENCOUNTER — Encounter (INDEPENDENT_AMBULATORY_CARE_PROVIDER_SITE_OTHER): Payer: Self-pay | Admitting: Nurse Practitioner

## 2023-09-06 NOTE — Progress Notes (Signed)
 Subjective:    Patient ID: Gina Cannon, female    DOB: [birth year], 58 y.o.   MRN: [id number] Chief Complaint  Patient presents with   Follow-up    1 year follow up     The patient returns to the office for followup evaluation regarding leg swelling.  The swelling has improved quite a bit and the pain associated with swelling has decreased substantially. There have not been any interval development of a ulcerations or wounds.  Since the previous visit the patient has been wearing graduated compression stockings.  She does have some stasis dermatitis in the area where her wound was previously.  The patient also states elevation during the day and exercise (such as walking) is being done too.       Review of Systems  Cardiovascular:  Positive for leg swelling.  All other systems reviewed and are negative.      Objective:   Physical Exam Vitals reviewed.  HENT:     Head: Normocephalic.  Cardiovascular:     Rate and Rhythm: Normal rate.  Pulmonary:     Effort: Pulmonary effort is normal.  Musculoskeletal:     Right lower leg: No edema.     Left lower leg: No edema.  Skin:    General: Skin is warm and dry.  Neurological:     Mental Status: She is alert and oriented to person, place, and time.  Psychiatric:        Mood and Affect: Mood normal.        Behavior: Behavior normal.        Thought Content: Thought content normal.        Judgment: Judgment normal.     BP 111/74   Pulse 89   Ht 5' 11 (1.803 m)   Wt 240 lb (108.9 kg)   LMP  (LMP Unknown)   BMI 33.47 kg/m   Past Medical History:  Diagnosis Date   Neuropathy     Social History   Socioeconomic History   Marital status: Single    Spouse name: Not on file   Number of children: Not on file   Years of education: Not on file   Highest education level: Not on file  Occupational History   Not on file  Tobacco Use   Smoking status: Some Days    Current packs/day: 0.50    Types: Cigarettes    Smokeless tobacco: Never  Vaping Use   Vaping status: Never Used  Substance and Sexual Activity   Alcohol use: Yes    Comment: occ   Drug use: No   Sexual activity: Not on file  Other Topics Concern   Not on file  Social History Narrative   Not on file   Social Drivers of Health   Financial Resource Strain: Low Risk  (08/28/2023)   Received from [facility]   Overall Financial Resource Strain (CARDIA)    Difficulty of Paying Living Expenses: Not very hard  Food Insecurity: No Food Insecurity (08/28/2023)   Received from [facility]   Hunger Vital Sign    Within the past 12 months, you worried that your food would run out before you got the money to buy more.: Never true    Within the past 12 months, the food you bought just didn't last and you didn't have money to get more.: Never true  Transportation Needs: No Transportation Needs (08/28/2023)   Received from [facility]  PRAPARE - Transportation    In the past 12 months, has lack of transportation kept you from medical appointments or from getting medications?: No    Lack of Transportation (Non-Medical): No  Physical Activity: Not on file  Stress: Not on file  Social Connections: Not on file  Intimate Partner Violence: Not on file    Past Surgical History:  Procedure Laterality Date   ABDOMINAL HYSTERECTOMY      Family History  Problem Relation Age of Onset   Diabetes Mother    Hypertension Mother    Diabetes Father    Hypertension Father     No Known Allergies     Latest Ref Rng & Units 07/31/2020    4:57 PM 09/27/2013    9:33 AM 09/18/2013    6:43 PM  CBC  WBC 4.0 - 10.5 K/uL 9.1  8.1  10.4   Hemoglobin 12.0 - 15.0 g/dL 86.8  86.2  86.6   Hematocrit 36.0 - 46.0 % 41.4  42.0  41.2   Platelets 150 - 400 K/uL 190  205  218       CMP     Component Value Date/Time   NA 139 07/31/2020 1657   NA 136 03/06/2013 1448   K 4.2 07/31/2020 1657   K 3.7  03/06/2013 1448   CL 103 07/31/2020 1657   CL 105 03/06/2013 1448   CO2 32 07/31/2020 1657   CO2 27 03/06/2013 1448   GLUCOSE 105 (H) 07/31/2020 1657   GLUCOSE 123 (H) 03/06/2013 1448   BUN 12 07/31/2020 1657   BUN 13 03/06/2013 1448   CREATININE 0.65 07/31/2020 1657   CREATININE 0.68 03/06/2013 1448   CALCIUM 9.2 07/31/2020 1657   CALCIUM 8.7 03/06/2013 1448   PROT 7.4 07/31/2020 1657   PROT 7.9 10/05/2012 1640   ALBUMIN 3.8 07/31/2020 1657   ALBUMIN 3.7 10/05/2012 1640   AST 27 07/31/2020 1657   AST 23 10/05/2012 1640   ALT 26 07/31/2020 1657   ALT 22 10/05/2012 1640   ALKPHOS 66 07/31/2020 1657   ALKPHOS 66 10/05/2012 1640   BILITOT 0.5 07/31/2020 1657   BILITOT 0.2 10/05/2012 1640   GFRNONAA >60 07/31/2020 1657   GFRNONAA >60 03/06/2013 1448     No results found.     Assessment & Plan:   1. Swelling of limb (Primary) Recommend:  No surgery or intervention at this point in time.    I have reviewed my previous discussion with the patient regarding swelling and why it  causes symptoms.  The patient is doing well with compression and will continue wearing graduated compression on a daily basis. The patient will  continue wearing the compression first thing in the morning and removing them in the evening. The patient is instructed specifically not to sleep in the compression.    In addition, behavioral modification including elevation during the day and exercise as tolerated will be continued.    Patient should follow-up on an annual basis    Current Outpatient Medications on File Prior to Visit  Medication Sig Dispense Refill   HYDROcodone -acetaminophen  (NORCO/VICODIN) 5-325 MG tablet Take 1 tablet by mouth.     metaxalone (SKELAXIN) 800 MG tablet May take 1/2-1 whole tablet up to 3 times daily as needed for pain or spasm.     benzonatate  (TESSALON  PERLES) 100 MG capsule Take 1 capsule (100 mg total) by mouth 3 (three) times daily as needed for cough. (Patient not  taking: Reported on 09/04/2023) 30 capsule  0   naproxen  (NAPROSYN ) 500 MG tablet Take 500 mg by mouth 2 (two) times daily with a meal. (Patient not taking: Reported on 09/04/2023)     No current facility-administered medications on file prior to visit.    There are no Patient Instructions on file for this visit. No follow-ups on file.   Ryver Poblete E Blessing Zaucha, NP

## 2023-09-10 ENCOUNTER — Other Ambulatory Visit: Payer: Self-pay | Admitting: Nurse Practitioner

## 2023-09-10 DIAGNOSIS — R928 Other abnormal and inconclusive findings on diagnostic imaging of breast: Secondary | ICD-10-CM

## 2023-09-11 ENCOUNTER — Ambulatory Visit
Admission: RE | Admit: 2023-09-11 | Discharge: 2023-09-11 | Disposition: A | Discharge status: Home / Self Care | Source: Ambulatory Visit | Attending: Nurse Practitioner | Admitting: Nurse Practitioner

## 2023-09-11 DIAGNOSIS — R928 Other abnormal and inconclusive findings on diagnostic imaging of breast: Secondary | ICD-10-CM | POA: Diagnosis present

## 2023-10-12 ENCOUNTER — Other Ambulatory Visit

## 2023-10-12 ENCOUNTER — Encounter

## 2023-11-27 ENCOUNTER — Other Ambulatory Visit: Payer: Self-pay

## 2023-11-27 ENCOUNTER — Emergency Department

## 2023-11-27 ENCOUNTER — Emergency Department
Admission: EM | Admit: 2023-11-27 | Discharge: 2023-11-27 | Disposition: A | Discharge status: Home / Self Care | Source: Ambulatory Visit | Attending: Emergency Medicine | Admitting: Emergency Medicine

## 2023-11-27 DIAGNOSIS — G939 Disorder of brain, unspecified: Secondary | ICD-10-CM | POA: Diagnosis not present

## 2023-11-27 DIAGNOSIS — R200 Anesthesia of skin: Secondary | ICD-10-CM | POA: Diagnosis present

## 2023-11-27 LAB — BASIC METABOLIC PANEL WITH GFR
Anion gap: 7 (ref 5–15)
BUN: 11 mg/dL (ref 6–20)
CO2: 28 mmol/L (ref 22–32)
Calcium: 8.9 mg/dL (ref 8.9–10.3)
Chloride: 108 mmol/L (ref 98–111)
Creatinine, Ser: 0.75 mg/dL (ref 0.44–1.00)
GFR, Estimated: >60 mL/min (ref >60)
Glucose, Bld: 94 mg/dL (ref 70–99)
Potassium: 4.2 mmol/L (ref 3.5–5.1)
Sodium: 143 mmol/L (ref 135–145)

## 2023-11-27 LAB — CBC
HCT: 42 % (ref 36.0–46.0)
Hemoglobin: 13.7 g/dL (ref 12.0–15.0)
MCH: 29.7 pg (ref 26.0–34.0)
MCHC: 32.6 g/dL (ref 30.0–36.0)
MCV: 90.9 fL (ref 80.0–100.0)
Platelets: 180 K/uL (ref 150–400)
RBC: 4.62 MIL/uL (ref 3.87–5.11)
RDW: 14.6 % (ref 11.5–15.5)
WBC: 7.9 K/uL (ref 4.0–10.5)
nRBC: 0 % (ref 0.0–0.2)

## 2023-11-27 MED ORDER — IOHEXOL 300 MG/ML  SOLN
100.0000 mL | Freq: Once | INTRAMUSCULAR | Status: AC | PRN
  Administered 2023-11-27: 100 mL via INTRAVENOUS

## 2023-11-27 MED ORDER — GADOBUTROL 1 MMOL/ML IV SOLN
10.0000 mL | Freq: Once | INTRAVENOUS | Status: AC | PRN
  Administered 2023-11-27: 10 mL via INTRAVENOUS

## 2023-11-27 NOTE — ED Notes (Signed)
 Pt to CT

## 2023-11-27 NOTE — ED Provider Notes (Addendum)
 [facility] Provider Note    Event Date/Time   First MD Initiated Contact with Patient 11/27/23 1259     (approximate)   History   Facial numbness   HPI  Gina Cannon is a 58 y.o. female with no significant PMH who presents with right-sided facial numbness for the last 2 or 3 days, persistent course.  The patient states that the numbness is in the middle and upper part of her right face and her right scalp up about to the top of her head.  It does not involve the lower face.  She reports increased tearing from the eye but no blurred vision or other vision changes.  She has no numbness, weakness, or tingling in her extremities, no speech disturbance, dizziness, balance problems, or any other new symptoms.  She denies any pain to the right side of the face.  She did recently use a new hair product, however she used this over her whole scalp and it was not isolated to the right side.  I reviewed the past medical records.  The patient's most recent outpatient encounter was on 8/22 with vascular surgery for follow-up of leg swelling.   Physical Exam   Triage Vital Signs: ED Triage Vitals  Encounter Vitals Group     BP 11/27/23 1021 134/86     Girls Systolic BP Percentile --      Girls Diastolic BP Percentile --      Boys Systolic BP Percentile --      Boys Diastolic BP Percentile --      Pulse Rate 11/27/23 1021 69     Resp 11/27/23 1021 17     Temp 11/27/23 1021 97.7 F (36.5 C)     Temp Source 11/27/23 1021 Oral     SpO2 11/27/23 1021 99 %     Weight 11/27/23 1023 254 lb (115.2 kg)     Height 11/27/23 1023 5' 11 (1.803 m)     Head Circumference --      Peak Flow --      Pain Score 11/27/23 1021 3     Pain Loc --      Pain Education --      Exclude from Growth Chart --     Most recent vital signs: Vitals:   11/27/23 1435 11/27/23 1758  BP:  137/65  Pulse:  84  Resp:  20  Temp: 97.6 F (36.4 C) 98 F (36.7 C)  SpO2:  100%      General: Awake, no distress.  CV:  Good peripheral perfusion.  Resp:  Normal effort.  Abd:  No distention.  Other:  Decreased sensation to the right middle and upper part of the face including the forehead and scalp up to the vertex.  No facial droop.  Cranial nerves III through XII otherwise grossly intact.  Normal speech.  EOMI.  PERRLA.  Motor and sensory intact in all extremities.  No ataxia on finger-to-nose.  No pronator drift.   ED Results / Procedures / Treatments   Labs (all labs ordered are listed, but only abnormal results are displayed) Labs Reviewed  CBC  BASIC METABOLIC PANEL WITH GFR     EKG     RADIOLOGY  MR brain:   IMPRESSION:  1. Enhancing mass (1.0 x 0.7 x 0.6 cm) in the right dorsolateral pons and  superior right middle cerebellar peduncle with surrounding edema, in close  proximity to the right trigeminal nerve root entry zone. Edema extends  into the  nerve root entry zone and possibly the posterior cisternal portion of the  nerve, potentially representing edema or reactive changes. Findings are  concerning for metastatic disease versus primary CNS neoplasm.  2. Curvilinear enhancement in the medial right internal auditory canal, likely  representing a loop of the right AICA. Recommend dedicated MRI of the internal  auditory canals for further evaluation.    PROCEDURES:  Critical Care performed: No  Procedures   MEDICATIONS ORDERED IN ED: Medications  gadobutrol (GADAVIST) 1 MMOL/ML injection 10 mL (10 mLs Intravenous Contrast Given 11/27/23 1515)  iohexol (OMNIPAQUE) 300 MG/ML solution 100 mL (100 mLs Intravenous Contrast Given 11/27/23 1714)     IMPRESSION / MDM / ASSESSMENT AND PLAN / ED COURSE  I reviewed the triage vital signs and the nursing notes.  58 year old female with PMH as noted above presents with decreased sensation to the right upper part of her face, right forehead, and right scalp up to the vertex for the last  couple of days.  A thorough neurologic exam was performed, she has no other deficits.  Her vital signs are normal except for borderline hypertension.  Differential diagnosis includes, but is not limited to, complex migraine, trigeminal neuralgia, demyelinating disease, Bell's palsy, less likely CVA.  BMP and CBC show no acute findings.  I consulted and discussed the case with Dr. Voncile from neurology who recommends an MRI with and without contrast for further evaluation.  Patient's presentation is most consistent with acute presentation with potential threat to life or bodily function.  The patient is on the cardiac monitor to evaluate for evidence of arrhythmia and/or significant heart rate changes.  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  I consulted and discussed the case with Dr. Claudene from neurosurgery who recommends close outpatient follow-up next week.  He advises that the patient does not need to be placed on seizure prophylaxis.  At this time, the patient is stable for discharge home.  I counseled her on the results of the workup including the mass, the possibility of cancer, and the need for close follow-up.  She is in agreement with the plan.  I gave strict return precautions, and she expressed understanding.  ----------------------------------------- 6:30 PM on 11/27/2023 -----------------------------------------  Dr. Claudene requested a CT of the chest, abdomen, and pelvis to make sure that the lesion was not a metastasis from another primary.  This CT is negative.  The patient remains stable for discharge home.  I counseled her on the results of this additional workup and plan of care.   FINAL CLINICAL IMPRESSION(S) / ED DIAGNOSES   Final diagnoses:  Brain lesion      Rx / DC Orders   ED Discharge Orders     None        Note:  This document was prepared using Dragon voice recognition software and may include unintentional dictation errors.    Jacolyn Pae, MD 11/27/23 1620    Jacolyn Pae, MD 11/27/23 [phone]

## 2023-11-27 NOTE — ED Notes (Signed)
 Pt to MRI

## 2023-11-27 NOTE — ED Notes (Signed)
 Provided phone for MRI screener.

## 2023-11-27 NOTE — Discharge Instructions (Addendum)
 Your CT is showing a small lesion in the part of your brain called the pons.  This is likely causing the numbness you are feeling in the face.  You should receive a call from the neurosurgery clinic for a follow-up appointment for next week.  If you do not receive a call by Tuesday, you should call the number provided.  Return to the ER immediately for new or worsening numbness, numbness spreading to new areas, weakness in the arm or leg, vision changes, difficulty with speech, balance problems, any strokelike symptoms, severe headache, vomiting, seizures, or any other new or worsening symptoms that concern you.

## 2023-11-27 NOTE — Progress Notes (Signed)
 Patient presents to triage with complaints of headache, dizziness, right-sided facial paresthesias.  To ED for further evaluation and care.

## 2023-11-27 NOTE — ED Triage Notes (Signed)
 Pt sent to ED from doctor's office for c/o right side facial numbness that began a few days ago. Pt states mild headache, denies dizziness or visual changes. Pt A&O, ambulatory to triage, NAD.

## 2023-11-30 ENCOUNTER — Telehealth: Payer: Self-pay | Admitting: Neurosurgery

## 2023-11-30 NOTE — Telephone Encounter (Signed)
 Confirmed with Glade Boys, PA before calling patient back that we are not able to give medical advise to the patient when they are not established with out office, this was confirmed. I called patient to let her know that unfortunately we are not able to give medical advise to her as she is not a patient here and to reach out to PCP in the meantime. Advised patient that we would add her to cancellation list if something comes up sooner. Patient also stated that she was referred to Dr Claudene not a PA. Advised patient that patient's referrals come in and we review them to see what provider they should see based on the symptoms and work up they have had and schedule them with either PA or MD. Patient asked again for recommendation and is there nothing she can take in the meantime, I advised patient again that unfortunately we are not able to give any medical advise as she is not a patient here yet. Patient stated Ok I will find somewhere else to go. Before I could say ok or anything else patient hang up.

## 2023-11-30 NOTE — Telephone Encounter (Signed)
 Pt is wanting to know what they can do in the meantime until their new pt appt in dec with Stacy. Side of the face is numb, around the eye up to the scalp. Also, if we can get her in sooner if possible?

## 2023-12-08 ENCOUNTER — Ambulatory Visit (INDEPENDENT_AMBULATORY_CARE_PROVIDER_SITE_OTHER): Admitting: Neurosurgery

## 2023-12-08 ENCOUNTER — Encounter: Payer: Self-pay | Admitting: Neurosurgery

## 2023-12-08 VITALS — BP 119/75 | HR 77 | Ht 71.0 in | Wt 252.0 lb

## 2023-12-08 DIAGNOSIS — R9402 Abnormal brain scan: Secondary | ICD-10-CM

## 2023-12-08 DIAGNOSIS — R201 Hypoesthesia of skin: Secondary | ICD-10-CM

## 2023-12-08 DIAGNOSIS — G939 Disorder of brain, unspecified: Secondary | ICD-10-CM

## 2023-12-08 NOTE — Progress Notes (Signed)
 " Assessment : Discussed the use of AI scribe software for clinical note transcription with the patient, who gave verbal consent to proceed.  History of Present Illness Gina Cannon is a 58 year old female who presents with facial numbness and a brain lesion. She was referred by the hospital for evaluation of a brain lesion.  Two weeks ago, she experienced numbness on the right side of her face, specifically around her eye, cheek, forehead, and part of her head. Initially, she thought it might be an allergic reaction to hair products and did not seek immediate medical attention. After two days of persistent numbness, she visited a walk-in clinic and was subsequently sent to the hospital.  At the hospital, an MRI revealed a small lesion on her brain. Since the initial onset, the numbness has slightly improved on its own. She has not received any specific treatment for the numbness but has taken Tylenol  for intermittent headaches.  She does not take any regular medications. Her past medical history includes a hysterectomy due to excessive bleeding, not related to cancer. Her family history includes diabetes, but no known neurological conditions such as multiple sclerosis or brain problems.  She works at [employer], where she is occasionally exposed to chemicals, as her workplace involves passing through a chemical room. She lives alone and has an adult son living in [place]. No history of heart or lung problems. Reports intermittent headaches.    Plan : On today's exam she definitely has hypnesthesia in V1 and to a much lesser degree in the right V2 area.  Her corneal anesthesia seems to be relatively spared.  I reviewed the MRI with one of our neuroradiologist and we both agree that this does not look like a metastasis.  This very well could be a demyelinating process or maybe even a lymphoma.  I went over the gym of this not being a metastasis and she was very happy to hear this.  I told her  that I would like to get a lumbar puncture and I have put in some lab work for an MS profile for demyelination in addition to cytology to rule out a lymphoma.  I will see her back thereafter.  She voiced understanding that if her condition worsens to go to the emergency room immediately.  I impressed upon her to never take any steroids until the lumbar puncture is done.  She voiced understanding of this.   Social History   Socioeconomic History   Marital status: Single    Spouse name: Not on file   Number of children: Not on file   Years of education: Not on file   Highest education level: Not on file  Occupational History   Not on file  Tobacco Use   Smoking status: Some Days    Current packs/day: 0.50    Types: Cigarettes   Smokeless tobacco: Never  Vaping Use   Vaping status: Never Used  Substance and Sexual Activity   Alcohol use: Yes    Comment: occ   Drug use: No   Sexual activity: Not on file  Other Topics Concern   Not on file  Social History Narrative   Not on file   Social Drivers of Health   Financial Resource Strain: Low Risk  (08/28/2023)   Received from [facility]   Overall Financial Resource Strain (CARDIA)    Difficulty of Paying Living Expenses: Not very hard  Food Insecurity: No Food Insecurity (08/28/2023)   Received  from [facility]   Hunger Vital Sign    Within the past 12 months, you worried that your food would run out before you got the money to buy more.: Never true    Within the past 12 months, the food you bought just didn't last and you didn't have money to get more.: Never true  Transportation Needs: No Transportation Needs (08/28/2023)   Received from [facility] - Transportation    In the past 12 months, has lack of transportation kept you from medical appointments or from getting medications?: No    Lack of Transportation (Non-Medical): No  Physical Activity: Not on file   Stress: Not on file  Social Connections: Not on file  Intimate Partner Violence: Not on file    Family History  Problem Relation Age of Onset   Diabetes Mother    Hypertension Mother    Diabetes Father    Hypertension Father     No Known Allergies  Past Medical History:  Diagnosis Date   Neuropathy     Past Surgical History:  Procedure Laterality Date   ABDOMINAL HYSTERECTOMY       Physical Exam   Physical Exam HENT:     Head: Normocephalic.     Nose: Nose normal.  Eyes:     Pupils: Pupils are equal, round, and reactive to light.  Cardiovascular:     Rate and Rhythm: Normal rate.  Pulmonary:     Effort: Pulmonary effort is normal.  Abdominal:     General: Abdomen is flat.  Musculoskeletal:     Cervical back: Normal range of motion.  Neurological:     Mental Status: She is alert.     Cranial Nerves: Cranial nerves 2-12 are intact, except hypesthesia RIGHT V1 and minimally V2     Sensory: Sensation is intact.     Motor: Motor function is intact. Masseter and Temporalis bilaterally equal    Coordination: Coordination is intact.     Results for orders placed or performed during the hospital encounter of 11/27/23  MR Brain W and Wo Contrast   Narrative   EXAM: MRI BRAIN WITH AND WITHOUT CONTRAST 11/27/2023 03:15:16 PM  TECHNIQUE: Multiplanar multisequence MRI of the head/brain was performed with and without the administration of 10 mL gadobutrol  (GADAVIST ) 1 MMOL/ML injection.  COMPARISON: None available.  CLINICAL HISTORY: Neuro deficit, acute, stroke suspected.  FINDINGS:  BRAIN AND VENTRICLES: No acute infarct. No acute intracranial hemorrhage. No midline shift. No hydrocephalus. There is a 1.0 x 0.7 x 0.6 cm enhancing mass within the right dorsolateral aspect of the pons and the superior aspect of the right middle cerebellar peduncle. There is surrounding edema along the right dorsolateral aspect of the pons. The mass is in close proximity  to the root entry zone of the right trigeminal nerve. There is edema extending into the root entry zone with signal abnormality possibly extending into the posterior cisternal portion of the right trigeminal nerve which may reflect edema versus reactive changes within the nerve. There is curvilinear enhancement extending into the medial aspect of the right internal auditory canal likely reflecting a loop of the right AICA. Consider dedicated MRI of the internal auditory canals for further evaluation. Normal flow voids. The sella is unremarkable.  ORBITS: No acute abnormality.  SINUSES: Multiple mucous retention cysts in the bilateral maxillary sinuses.  BONES AND SOFT TISSUES: Normal bone marrow signal and enhancement. No acute soft tissue abnormality.  IMPRESSION: 1.  Enhancing mass (1.0 x 0.7 x 0.6 cm) in the right dorsolateral pons and superior right middle cerebellar peduncle with surrounding edema, in close proximity to the right trigeminal nerve root entry zone. Edema extends into the nerve root entry zone and possibly the posterior cisternal portion of the nerve, potentially representing edema or reactive changes. Findings are concerning for metastatic disease versus primary CNS neoplasm. 2. Curvilinear enhancement in the medial right internal auditory canal, likely representing a loop of the right AICA. Recommend dedicated MRI of the internal auditory canals for further evaluation.  Electronically signed by: Donnice Mania MD 11/27/2023 03:50 PM EST RP Workstation: [id number]     "

## 2023-12-17 ENCOUNTER — Telehealth: Payer: Self-pay

## 2023-12-17 NOTE — Telephone Encounter (Signed)
 Patient called and asked if she could take cold medicine with her medical condition. I advised her there is no contraindications with an aneurysm and cold medications.

## 2023-12-21 ENCOUNTER — Telehealth: Payer: Self-pay

## 2023-12-21 NOTE — Telephone Encounter (Signed)
 Patient called office and spoke with Rosaline Custard, admin lead. She told her that the [facility] location that she went to over the weekend told her that the test was not ordered correctly and that they did not do that test there.   I emailed Jon Coon, our representative for [facility] then called about 40 mins later.   I am going to call the [place] locations at 1pm to confirm why she was told this information and verify if this test can be done there.

## 2023-12-22 ENCOUNTER — Other Ambulatory Visit: Payer: Self-pay

## 2023-12-22 NOTE — Addendum Note (Signed)
 Addended by: Marq Rebello on: 12/22/2023 10:11 AM   Modules accepted: Orders

## 2023-12-24 NOTE — Discharge Instructions (Signed)

## 2023-12-25 ENCOUNTER — Inpatient Hospital Stay: Admission: RE | Admit: 2023-12-25 | Discharge: 2023-12-25 | Attending: Neurosurgery | Admitting: Neurosurgery

## 2023-12-25 VITALS — BP 127/65 | HR 61 | Resp 18

## 2023-12-25 DIAGNOSIS — G939 Disorder of brain, unspecified: Secondary | ICD-10-CM

## 2023-12-25 DIAGNOSIS — M7989 Other specified soft tissue disorders: Secondary | ICD-10-CM

## 2023-12-29 ENCOUNTER — Ambulatory Visit: Admitting: Orthopedic Surgery

## 2023-12-31 LAB — TEST AUTHORIZATION: TEST CODE:: 17728

## 2024-01-01 ENCOUNTER — Ambulatory Visit: Admitting: Neurosurgery

## 2024-01-01 ENCOUNTER — Encounter: Payer: Self-pay | Admitting: Neurosurgery

## 2024-01-01 VITALS — BP 131/73 | HR 73 | Temp 98.3°F | Ht 71.0 in | Wt 249.0 lb

## 2024-01-01 DIAGNOSIS — G939 Disorder of brain, unspecified: Secondary | ICD-10-CM

## 2024-01-01 DIAGNOSIS — R29818 Other symptoms and signs involving the nervous system: Secondary | ICD-10-CM | POA: Diagnosis not present

## 2024-01-01 NOTE — Progress Notes (Signed)
 58 year old lady with a sudden onset of right sided upper facial numbness and finding of enhancement in the brachium pontis.  I did lumbar puncture and this demonstrated normal pressure, no significant elevated white blood cell count, negative cytology and the multiple sclerosis panel, inasmuch as I have access to it, is negative [no oligoclonal bands.  She returns today and explicitly states that she has not taken any steroids.  She says that her facial numbness is significantly better and she barely has any.  On examination she says there is a little bit of difference around her nasolabial fold but otherwise she feels great.  I told her that from my standpoint I do not believe that she has an oncological disease: If this were to be the case, then I would expect her to continue to get worse, which she is not.  To that end, it very well might be that she has had either an ischemic event [unlikely] or an inflammatory process.  I will make a referral to neurology for this evaluation and she is welcome to come and see me as needed.  She voiced understanding that if her symptoms recur to call and come and see me again.

## 2024-01-06 ENCOUNTER — Telehealth: Payer: Self-pay | Admitting: Diagnostic Neuroimaging

## 2024-01-06 ENCOUNTER — Ambulatory Visit: Admitting: Diagnostic Neuroimaging

## 2024-01-06 ENCOUNTER — Encounter: Payer: Self-pay | Admitting: Diagnostic Neuroimaging

## 2024-01-06 VITALS — BP 122/74 | HR 82 | Ht 71.0 in | Wt 252.0 lb

## 2024-01-06 DIAGNOSIS — R200 Anesthesia of skin: Secondary | ICD-10-CM | POA: Diagnosis not present

## 2024-01-06 DIAGNOSIS — R9089 Other abnormal findings on diagnostic imaging of central nervous system: Secondary | ICD-10-CM

## 2024-01-06 NOTE — Patient Instructions (Signed)
-   follow up CSF testing (IgG index / synthesis) - repeat MRI brain w/wo; also check MRI cervical and thoracic spine  - follow up with PCP re: stroke risk factor screening (BP, lipids, sugar)

## 2024-01-06 NOTE — Telephone Encounter (Signed)
 sent to GI they obtain [place] [phone]

## 2024-01-06 NOTE — Progress Notes (Signed)
 "  [facility]  PATIENT: Gina Cannon DOB: 01-23-1965  REFERRING CLINICIAN: Rosslyn Dino HERO, MD HISTORY FROM: patient  REASON FOR VISIT: new consult   HISTORICAL  CHIEF COMPLAINT:  Chief Complaint  Patient presents with   RM 7     Patient is here alone for possible brain lesions - no questions at this time just wants to know what is going on     HISTORY OF PRESENT ILLNESS:   58 year old female here for evaluation of abnormal MRI brain and right facial numbness.  11/24/2023 patient woke up in normal state of health.  She got ready and went to work.  Around 9:00 the morning she had fairly sudden onset of right upper and middle facial numbness sensation.  This may have gradually progressed over time.  She waited a couple of days to see if this would resolve.  On 11/27/2023 she went to PCP and then went to emergency room for evaluation.  MRI of the brain demonstrated a small enhancing lesion in the right middle cerebral peduncle without abnormal diffusion signal.  Possibility of mass or inflammatory lesion was raised.  She saw neurosurgery for evaluation.  Spinal tap was obtained to investigate for malignancy versus autoimmune or demyelinating disease.  Over several weeks symptoms gradually improved.  Patient is back to normal for the last 2 weeks.  Patient denies any problems with left side of face, upper or lower extremities.  No prior unilateral vision problems, speech issues swallowing issues, bowel or bladder issues.  No prodromal accidents injuries traumas, viral infections or vaccines.  No family history of multiple sclerosis or autoimmune conditions.   REVIEW OF SYSTEMS: Full 14 system review of systems performed and negative with exception of: As per HPI.  ALLERGIES: Allergies[1]  HOME MEDICATIONS: Outpatient Medications Prior to Visit  Medication Sig Dispense Refill   metaxalone (SKELAXIN) 800 MG tablet May take 1/2-1 whole tablet up to 3 times  daily as needed for pain or spasm. (Patient not taking: Reported on 01/06/2024)     No facility-administered medications prior to visit.    PAST MEDICAL HISTORY: Past Medical History:  Diagnosis Date   Neuropathy     PAST SURGICAL HISTORY: Past Surgical History:  Procedure Laterality Date   ABDOMINAL HYSTERECTOMY      FAMILY HISTORY: Family History  Problem Relation Age of Onset   Stroke Mother    Diabetes Mother    Hypertension Mother    Diabetes Father    Hypertension Father    Stroke Brother    Migraines Neg Hx    Seizures Neg Hx    Sleep apnea Neg Hx     SOCIAL HISTORY: Social History   Socioeconomic History   Marital status: Single    Spouse name: Not on file   Number of children: Not on file   Years of education: Not on file   Highest education level: Not on file  Occupational History   Not on file  Tobacco Use   Smoking status: Some Days    Current packs/day: 0.50    Types: Cigarettes   Smokeless tobacco: Never  Vaping Use   Vaping status: Never Used  Substance and Sexual Activity   Alcohol use: Yes    Comment: occ   Drug use: No   Sexual activity: Not on file  Other Topics Concern   Not on file  Social History Narrative   Some coffee    Social Drivers of Health   Tobacco Use: High  Risk (01/06/2024)   Patient History    Smoking Tobacco Use: Some Days    Smokeless Tobacco Use: Never    Passive Exposure: Not on file  Financial Resource Strain: Low Risk  (08/28/2023)   Received from [facility]   Overall Financial Resource Strain (CARDIA)    Difficulty of Paying Living Expenses: Not very hard  Food Insecurity: No Food Insecurity (08/28/2023)   Received from [facility]   Epic    Within the past 12 months, you worried that your food would run out before you got the money to buy more.: Never true    Within the past 12 months, the food you bought just didn't last and you didn't have money to get more.: Never  true  Transportation Needs: No Transportation Needs (08/28/2023)   Received from [facility] - Transportation    In the past 12 months, has lack of transportation kept you from medical appointments or from getting medications?: No    Lack of Transportation (Non-Medical): No  Physical Activity: Not on file  Stress: Not on file  Social Connections: Not on file  Intimate Partner Violence: Not on file  Depression (EYV7-0): Not on file  Alcohol Screen: Not on file  Housing: Low Risk  (08/28/2023)   Received from [facility]   Epic    In the last 12 months, was there a time when you were not able to pay the mortgage or rent on time?: No    In the past 12 months, how many times have you moved where you were living?: 0    At any time in the past 12 months, were you homeless or living in a shelter (including now)?: No  Utilities: Not At Risk (08/28/2023)   Received from [facility]   Epic    In the past 12 months has the electric, gas, oil, or water company threatened to shut off services in your home?: No  Health Literacy: Not on file     PHYSICAL EXAM  GENERAL EXAM/CONSTITUTIONAL: Vitals:  Vitals:   01/06/24 0916  BP: 122/74  Pulse: 82  Weight: 252 lb (114.3 kg)  Height: 5' 11 (1.803 m)   Body mass index is 35.15 kg/m. Wt Readings from Last 3 Encounters:  01/06/24 252 lb (114.3 kg)  01/01/24 249 lb (112.9 kg)  12/08/23 252 lb (114.3 kg)   Patient is in no distress; well developed, nourished and groomed; neck is supple  CARDIOVASCULAR: Examination of carotid arteries is normal; no carotid bruits Regular rate and rhythm, no murmurs Examination of peripheral vascular system by observation and palpation is normal  EYES: Ophthalmoscopic exam of optic discs and posterior segments is normal; no papilledema or hemorrhages No results found.  MUSCULOSKELETAL: Gait, strength, tone, movements noted in Neurologic exam  below  NEUROLOGIC: MENTAL STATUS:      No data to display         awake, alert, oriented to person, place and time recent and remote memory intact normal attention and concentration language fluent, comprehension intact, naming intact fund of knowledge appropriate  CRANIAL NERVE:  2nd - no papilledema on fundoscopic exam 2nd, 3rd, 4th, 6th - pupils equal and reactive to light, visual fields full to confrontation, extraocular muscles intact, no nystagmus 5th - facial sensation symmetric 7th - facial strength symmetric 8th - hearing intact 9th - palate elevates symmetrically, uvula midline 11th - shoulder shrug symmetric 12th - tongue  protrusion midline  MOTOR:  normal bulk and tone, full strength in the BUE, BLE  SENSORY:  normal and symmetric to light touch, temperature, vibration  COORDINATION:  finger-nose-finger, fine finger movements normal  REFLEXES:  deep tendon reflexes present and symmetric  GAIT/STATION:  narrow based gait     DIAGNOSTIC DATA (LABS, IMAGING, TESTING) - I reviewed patient records, labs, notes, testing and imaging myself where available.  Lab Results  Component Value Date   WBC 7.9 11/27/2023   HGB 13.7 11/27/2023   HCT 42.0 11/27/2023   MCV 90.9 11/27/2023   PLT 180 11/27/2023      Component Value Date/Time   NA 143 11/27/2023 1025   NA 136 03/06/2013 1448   K 4.2 11/27/2023 1025   K 3.7 03/06/2013 1448   CL 108 11/27/2023 1025   CL 105 03/06/2013 1448   CO2 28 11/27/2023 1025   CO2 27 03/06/2013 1448   GLUCOSE 94 11/27/2023 1025   GLUCOSE 123 (H) 03/06/2013 1448   BUN 11 11/27/2023 1025   BUN 13 03/06/2013 1448   CREATININE 0.75 11/27/2023 1025   CREATININE 0.68 03/06/2013 1448   CALCIUM 8.9 11/27/2023 1025   CALCIUM 8.7 03/06/2013 1448   PROT 7.4 07/31/2020 1657   PROT 7.9 10/05/2012 1640   ALBUMIN 3.8 07/31/2020 1657   ALBUMIN 3.7 10/05/2012 1640   AST 27 07/31/2020 1657   AST 23 10/05/2012 1640   ALT 26  07/31/2020 1657   ALT 22 10/05/2012 1640   ALKPHOS 66 07/31/2020 1657   ALKPHOS 66 10/05/2012 1640   BILITOT 0.5 07/31/2020 1657   BILITOT 0.2 10/05/2012 1640   GFRNONAA >60 11/27/2023 1025   GFRNONAA >60 03/06/2013 1448   GFRAA >90 09/18/2013 1843   GFRAA >60 03/06/2013 1448   No results found for: CHOL, HDL, LDLCALC, LDLDIRECT, TRIG, CHOLHDL Lab Results  Component Value Date   HGBA1C 5.7 09/27/2013   No results found for: VITAMINB12 No results found for: TSH  12/25/23 Oligo Bands Absent  Comment: . FOUR identical (mirror image) gamma restriction bands are observed in the patient's CSF and serum sample. This finding is indicative of systemic rather than intra-cerebral synthesis of gamma globulins.   11/27/23 CT chest / abd / pelvis 1. 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 2. Other, non-acute and/or normal findings as above.  11/27/23 MRI brain [I reviewed images myself. Considerations include demyelinating plaque, autoimmune, inflamm lesion. Classic location for multiple sclerosis. -VRP] 1. Enhancing mass (1.0 x 0.7 x 0.6 cm) in the right dorsolateral pons and superior right middle cerebellar peduncle with surrounding edema, in close proximity to the right trigeminal nerve root entry zone. Edema extends into the nerve root entry zone and possibly the posterior cisternal portion of the nerve, potentially representing edema or reactive changes. Findings are concerning for metastatic disease versus primary CNS neoplasm. 2. Curvilinear enhancement in the medial right internal auditory canal, likely representing a loop of the right AICA. Recommend dedicated MRI of the internal auditory canals for further evaluation.   ASSESSMENT AND PLAN  58 y.o. year old female here with:   Dx:  1. MRI of brain abnormal   2. Right facial numbness      PLAN:  RIGHT FACIAL NUMBNESS  (sudden onset on 11/24/23 lasting ~4 weeks, now resolved; abnormal enhancing lesion noted that is suspicious for autoimmune, inflamm or demyelinating event; could represent clinically isolated syndrome; vascular or stroke event  possible but less likely) - follow up CSF testing (IgG index / synthesis) - repeat MRI brain w/wo (to ensure resolution); also check MRI cervical and thoracic spine (demyelinating disease screening) - follow up with PCP re: stroke risk factor screening (BP, lipids, sugar)  Orders Placed This Encounter  Procedures   MR BRAIN W WO CONTRAST   MR CERVICAL SPINE W WO CONTRAST   MR THORACIC SPINE W WO CONTRAST   Return in about 6 weeks (around 02/17/2024) for MyChart visit (15 min).  I reviewed images, labs, notes, records myself. I summarized findings and reviewed with patient, for this high risk condition (abnl MRI brain; autoimmune, vascular vs neoplasm evaluation) requiring high complexity decision making.   EDUARD FABIENE HANLON, MD 01/06/2024, 9:55 AM Certified in Neurology, Neurophysiology and Neuroimaging  [facility] [address], [address] [place], [place] [address] [phone]     [1] No Known Allergies  "

## 2024-01-11 ENCOUNTER — Telehealth: Payer: Self-pay | Admitting: Neurosurgery

## 2024-01-11 NOTE — Telephone Encounter (Signed)
 [facility] also faxed over lab results which have been scanned to patients chart

## 2024-01-11 NOTE — Telephone Encounter (Signed)
 Gina Cannon with [facility] called the after hours nurse line regarding patients lab results. Return phone number provided for [facility] is [phone] with reference number [id number] [phone]

## 2024-01-11 NOTE — Telephone Encounter (Signed)
 I spoke with [facility] Representative Tiffany, One lab result is still pending as it was received by 3rd party on 12/24. Processing time is 4 days so there should be a result by the end of the week.   Once this is back I will let Dr. Rosslyn know that all lab results are back.

## 2024-01-12 LAB — CNS IGG SYNTHESIS RATE, CSF+BLOOD
Albumin Serum: 3.9 g/dL (ref 3.6–5.1)
Albumin, CSF: 19.6 mg/dL (ref 8.0–42.0)
CNS-IgG Synthesis Rate: -5.2 mg/(24.h) (ref <=3.3)
IgG (Immunoglobin G), Serum: 1880 mg/dL — ABNORMAL HIGH (ref 600–1640)
IgG Total CSF: 4.6 mg/dL (ref 0.8–7.7)
IgG-Index: 0.49

## 2024-01-12 LAB — GLUCOSE, CSF: Glucose, CSF: 66 mg/dL (ref 40–80)

## 2024-01-12 LAB — OLIGOCLONAL BANDS, CSF + SERM: Oligo Bands: ABSENT

## 2024-01-12 LAB — CSF CELL COUNT WITH DIFFERENTIAL
RBC Count, CSF: 1 {cells}/uL — ABNORMAL HIGH
TOTAL NUCLEATED CELL: 3 {cells}/uL (ref 0–5)

## 2024-01-12 LAB — MYELIN BASIC PROTEIN, CSF

## 2024-01-12 LAB — PROTEIN, CSF: Total Protein, CSF: 40 mg/dL (ref 15–45)

## 2024-01-12 LAB — LEUKEMIA/LYMPHOMA EVALUATION PANEL

## 2024-01-28 ENCOUNTER — Encounter: Payer: Self-pay | Admitting: Diagnostic Neuroimaging

## 2024-02-09 ENCOUNTER — Other Ambulatory Visit

## 2024-02-12 ENCOUNTER — Other Ambulatory Visit

## 2024-02-17 ENCOUNTER — Encounter: Payer: Self-pay | Admitting: Diagnostic Neuroimaging

## 2024-02-17 ENCOUNTER — Telehealth: Admitting: Diagnostic Neuroimaging

## 2024-02-17 DIAGNOSIS — R200 Anesthesia of skin: Secondary | ICD-10-CM

## 2024-02-17 DIAGNOSIS — R9089 Other abnormal findings on diagnostic imaging of central nervous system: Secondary | ICD-10-CM

## 2024-02-17 NOTE — Progress Notes (Signed)
 "  [facility]  PATIENT: Gina Cannon DOB: 1965/07/21  REFERRING CLINICIAN: [facility] HISTORY FROM: patient  REASON FOR VISIT: follow up    HISTORICAL  CHIEF COMPLAINT:   Right facial tingling   HISTORY OF PRESENT ILLNESS:   UPDATE (02/17/24, VRP): Since last visit, doing well. Symptoms are improving. Symptoms slightly triggered by chewing. MRIs are pending.   PRIOR HPI (01/06/24, VRP): 59 year old female here for evaluation of abnormal MRI brain and right facial numbness.  11/24/2023 patient woke up in normal state of health.  She got ready and went to work.  Around 9:00 the morning she had fairly sudden onset of right upper and middle facial numbness sensation.  This may have gradually progressed over time.  She waited a couple of days to see if this would resolve.  On 11/27/2023 she went to PCP and then went to emergency room for evaluation.  MRI of the brain demonstrated a small enhancing lesion in the right middle cerebral peduncle without abnormal diffusion signal.  Possibility of mass or inflammatory lesion was raised.  She saw neurosurgery for evaluation.  Spinal tap was obtained to investigate for malignancy versus autoimmune or demyelinating disease.  Over several weeks symptoms gradually improved.  Patient is back to normal for the last 2 weeks.  Patient denies any problems with left side of face, upper or lower extremities.  No prior unilateral vision problems, speech issues swallowing issues, bowel or bladder issues.  No prodromal accidents injuries traumas, viral infections or vaccines.  No family history of multiple sclerosis or autoimmune conditions.   REVIEW OF SYSTEMS: Full 14 system review of systems performed and negative with exception of: As per HPI.  ALLERGIES: Allergies[1]  HOME MEDICATIONS: Outpatient Medications Prior to Visit  Medication Sig Dispense Refill   metaxalone (SKELAXIN) 800 MG tablet May take  1/2-1 whole tablet up to 3 times daily as needed for pain or spasm.     No facility-administered medications prior to visit.    PAST MEDICAL HISTORY: Past Medical History:  Diagnosis Date   Neuropathy     PAST SURGICAL HISTORY: Past Surgical History:  Procedure Laterality Date   ABDOMINAL HYSTERECTOMY      FAMILY HISTORY: Family History  Problem Relation Age of Onset   Stroke Mother    Diabetes Mother    Hypertension Mother    Diabetes Father    Hypertension Father    Stroke Brother    Migraines Neg Hx    Seizures Neg Hx    Sleep apnea Neg Hx     SOCIAL HISTORY: Social History   Socioeconomic History   Marital status: Single    Spouse name: Not on file   Number of children: Not on file   Years of education: Not on file   Highest education level: Not on file  Occupational History   Not on file  Tobacco Use   Smoking status: Some Days    Current packs/day: 0.50    Types: Cigarettes   Smokeless tobacco: Never  Vaping Use   Vaping status: Never Used  Substance and Sexual Activity   Alcohol use: Yes    Comment: occ   Drug use: No   Sexual activity: Not on file  Other Topics Concern   Not on file  Social History Narrative   coffee  cup weekly    Social Drivers of Health   Tobacco Use: High Risk (02/17/2024)   Patient History    Smoking Tobacco Use:  Some Days    Smokeless Tobacco Use: Never    Passive Exposure: Not on file  Financial Resource Strain: Low Risk  (08/28/2023)   Received from [facility]   Overall Financial Resource Strain (CARDIA)    Difficulty of Paying Living Expenses: Not very hard  Food Insecurity: No Food Insecurity (08/28/2023)   Received from [facility]   Epic    Within the past 12 months, you worried that your food would run out before you got the money to buy more.: Never true    Within the past 12 months, the food you bought just didn't last and you didn't have money to get more.: Never true   Transportation Needs: No Transportation Needs (08/28/2023)   Received from [facility] Transportation    In the past 12 months, has lack of transportation kept you from medical appointments or from getting medications?: No    Lack of Transportation (Non-Medical): No  Physical Activity: Not on file  Stress: Not on file  Social Connections: Not on file  Intimate Partner Violence: Not on file  Depression (EYV7-0): Not on file  Alcohol Screen: Not on file  Housing: Low Risk  (08/28/2023)   Received from [facility]   Epic    In the last 12 months, was there a time when you were not able to pay the mortgage or rent on time?: No    In the past 12 months, how many times have you moved where you were living?: 0    At any time in the past 12 months, were you homeless or living in a shelter (including now)?: No  Utilities: Not At Risk (08/28/2023)   Received from [facility]   Epic    In the past 12 months has the electric, gas, oil, or water company threatened to shut off services in your home?: No  Health Literacy: Not on file     PHYSICAL EXAM  GENERAL EXAM/CONSTITUTIONAL: Vitals:  There were no vitals filed for this visit.  There is no height or weight on file to calculate BMI. Wt Readings from Last 3 Encounters:  01/06/24 252 lb (114.3 kg)  01/01/24 249 lb (112.9 kg)  12/08/23 252 lb (114.3 kg)   Patient is in no distress; well developed, nourished and groomed; neck is supple  CARDIOVASCULAR: Examination of carotid arteries is normal; no carotid bruits Regular rate and rhythm, no murmurs Examination of peripheral vascular system by observation and palpation is normal  EYES: Ophthalmoscopic exam of optic discs and posterior segments is normal; no papilledema or hemorrhages No results found.  MUSCULOSKELETAL: Gait, strength, tone, movements noted in Neurologic exam below  NEUROLOGIC: MENTAL STATUS:      No  data to display         awake, alert, oriented to person, place and time recent and remote memory intact normal attention and concentration language fluent, comprehension intact, naming intact fund of knowledge appropriate  CRANIAL NERVE:  2nd - no papilledema on fundoscopic exam 2nd, 3rd, 4th, 6th - pupils equal and reactive to light, visual fields full to confrontation, extraocular muscles intact, no nystagmus 5th - facial sensation symmetric 7th - facial strength symmetric 8th - hearing intact 9th - palate elevates symmetrically, uvula midline 11th - shoulder shrug symmetric 12th - tongue protrusion midline  MOTOR:  normal bulk and tone, full strength in the BUE, BLE  SENSORY:  normal and symmetric to light  touch, temperature, vibration  COORDINATION:  finger-nose-finger, fine finger movements normal  REFLEXES:  deep tendon reflexes present and symmetric  GAIT/STATION:  narrow based gait     DIAGNOSTIC DATA (LABS, IMAGING, TESTING) - I reviewed patient records, labs, notes, testing and imaging myself where available.  Lab Results  Component Value Date   WBC 7.9 11/27/2023   HGB 13.7 11/27/2023   HCT 42.0 11/27/2023   MCV 90.9 11/27/2023   PLT 180 11/27/2023      Component Value Date/Time   NA 143 11/27/2023 1025   NA 136 03/06/2013 1448   K 4.2 11/27/2023 1025   K 3.7 03/06/2013 1448   CL 108 11/27/2023 1025   CL 105 03/06/2013 1448   CO2 28 11/27/2023 1025   CO2 27 03/06/2013 1448   GLUCOSE 94 11/27/2023 1025   GLUCOSE 123 (H) 03/06/2013 1448   BUN 11 11/27/2023 1025   BUN 13 03/06/2013 1448   CREATININE 0.75 11/27/2023 1025   CREATININE 0.68 03/06/2013 1448   CALCIUM 8.9 11/27/2023 1025   CALCIUM 8.7 03/06/2013 1448   PROT 7.4 07/31/2020 1657   PROT 7.9 10/05/2012 1640   ALBUMIN 3.9 12/25/2023 0924   ALBUMIN 3.7 10/05/2012 1640   AST 27 07/31/2020 1657   AST 23 10/05/2012 1640   ALT 26 07/31/2020 1657   ALT 22 10/05/2012 1640   ALKPHOS  66 07/31/2020 1657   ALKPHOS 66 10/05/2012 1640   BILITOT 0.5 07/31/2020 1657   BILITOT 0.2 10/05/2012 1640   GFRNONAA >60 11/27/2023 1025   GFRNONAA >60 03/06/2013 1448   GFRAA >90 09/18/2013 1843   GFRAA >60 03/06/2013 1448   No results found for: CHOL, HDL, LDLCALC, LDLDIRECT, TRIG, CHOLHDL Lab Results  Component Value Date   HGBA1C 5.7 09/27/2013   No results found for: VITAMINB12 No results found for: TSH  12/25/23  Component Ref Range & Units (hover) 1 mo ago  Color, CSF COLORLESS  Appearance, CSF CLEAR  RBC Count, CSF 1 High   TOTAL NUCLEATED CELL 3    Oligo Bands Absent  Comment: . FOUR identical (mirror image) gamma restriction bands are observed in the patient's CSF and serum sample. This finding is indicative of systemic rather than intra-cerebral synthesis of gamma globulins.   Component Ref Range & Units (hover) 1 mo ago  CNS-IgG Synthesis Rate -5.2  IgG-Index 0.49  Comment: . The IgG Synthesis rate, CSF and IgG index, CSF are two formulae for estimating the amount of IgG produced in the central nervous system.  Evidence of increased synthesis of IgG provides support for the diagnosis of multiple sclerosis. .  Albumin, CSF 19.6  IgG Total CSF 4.6  IgG (Immunoglobin G), Serum 1,880 High   Albumin Serum 3.9    11/27/23 CT chest / abd / pelvis 1. 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 2. Other, non-acute and/or normal findings as above.  11/27/23 MRI brain [I reviewed images myself. Considerations include demyelinating plaque, autoimmune, inflamm lesion. Classic location for multiple sclerosis. -VRP] 1. Enhancing mass (1.0 x 0.7 x 0.6 cm) in the right dorsolateral pons and superior right middle cerebellar peduncle with surrounding edema, in close proximity to the right trigeminal nerve root entry zone. Edema extends into the nerve root entry zone and  possibly the posterior cisternal portion of the nerve, potentially representing edema or reactive changes. Findings are concerning for metastatic disease versus primary CNS neoplasm. 2. Curvilinear enhancement in  the medial right internal auditory canal, likely representing a loop of the right AICA. Recommend dedicated MRI of the internal auditory canals for further evaluation.   ASSESSMENT AND PLAN  59 y.o. year old female here with:   Dx:  1. Right facial numbness   2. MRI of brain abnormal     PLAN:  RIGHT FACIAL NUMBNESS (sudden onset on 11/24/23 lasting ~4 weeks, now resolved; abnormal enhancing lesion noted that is suspicious for autoimmune, inflamm or demyelinating event; however CSF and lab testing have been unremarkable) - repeat MRI brain w/wo (to ensure resolution); also check MRI cervical and thoracic spine (demyelinating disease screening) - follow up with PCP re: stroke risk factor screening (BP, lipids, sugar)  Return for pending test results, pending if symptoms worsen or fail to improve.    EDUARD FABIENE HANLON, MD 02/17/2024, 4:33 PM Certified in Neurology, Neurophysiology and Neuroimaging  [facility] [address] [place], [place] [address] [phone]     [1] No Known Allergies  "

## 2024-02-17 NOTE — Patient Instructions (Signed)
" °-   repeat MRI brain w/wo (to ensure resolution); also check MRI cervical and thoracic spine (demyelinating disease screening)  - follow up with PCP re: stroke risk factor screening (BP, lipids, sugar) "

## 2024-03-09 ENCOUNTER — Other Ambulatory Visit

## 2024-03-13 ENCOUNTER — Other Ambulatory Visit

## 2024-09-02 ENCOUNTER — Ambulatory Visit (INDEPENDENT_AMBULATORY_CARE_PROVIDER_SITE_OTHER): Admitting: Nurse Practitioner
